# Patient Record
Sex: Male | Born: 1995 | Race: White | Hispanic: No | Marital: Single | Smoking: Never smoker
Health system: Southern US, Community
[De-identification: ages and names within clinical notes are randomized; demographics above are authoritative.]

## PROBLEM LIST (undated history)

## (undated) DIAGNOSIS — G919 Hydrocephalus, unspecified: Secondary | ICD-10-CM

## (undated) DIAGNOSIS — F909 Attention-deficit hyperactivity disorder, unspecified type: Secondary | ICD-10-CM

## (undated) DIAGNOSIS — I10 Essential (primary) hypertension: Secondary | ICD-10-CM

## (undated) DIAGNOSIS — R4689 Other symptoms and signs involving appearance and behavior: Secondary | ICD-10-CM

## (undated) DIAGNOSIS — F79 Unspecified intellectual disabilities: Secondary | ICD-10-CM

## (undated) DIAGNOSIS — F913 Oppositional defiant disorder: Secondary | ICD-10-CM

## (undated) HISTORY — PX: ELBOW SURGERY: SHX618

## (undated) HISTORY — PX: CSF SHUNT: SHX92

---
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
## 2014-02-19 ENCOUNTER — Encounter (HOSPITAL_COMMUNITY): Payer: Self-pay | Admitting: *Deleted

## 2014-02-19 ENCOUNTER — Emergency Department (HOSPITAL_COMMUNITY)
Admission: EM | Admit: 2014-02-19 | Discharge: 2014-02-21 | Disposition: A | Discharge status: Home / Self Care | Payer: Medicaid Other | Attending: Emergency Medicine | Admitting: Emergency Medicine

## 2014-02-19 DIAGNOSIS — Z79899 Other long term (current) drug therapy: Secondary | ICD-10-CM | POA: Insufficient documentation

## 2014-02-19 DIAGNOSIS — F909 Attention-deficit hyperactivity disorder, unspecified type: Secondary | ICD-10-CM | POA: Diagnosis not present

## 2014-02-19 DIAGNOSIS — Z8669 Personal history of other diseases of the nervous system and sense organs: Secondary | ICD-10-CM | POA: Diagnosis not present

## 2014-02-19 DIAGNOSIS — I10 Essential (primary) hypertension: Secondary | ICD-10-CM | POA: Insufficient documentation

## 2014-02-19 DIAGNOSIS — Y998 Other external cause status: Secondary | ICD-10-CM | POA: Diagnosis not present

## 2014-02-19 DIAGNOSIS — F911 Conduct disorder, childhood-onset type: Secondary | ICD-10-CM | POA: Diagnosis not present

## 2014-02-19 DIAGNOSIS — Y9389 Activity, other specified: Secondary | ICD-10-CM | POA: Insufficient documentation

## 2014-02-19 DIAGNOSIS — R4689 Other symptoms and signs involving appearance and behavior: Secondary | ICD-10-CM

## 2014-02-19 DIAGNOSIS — S40212A Abrasion of left shoulder, initial encounter: Secondary | ICD-10-CM | POA: Insufficient documentation

## 2014-02-19 DIAGNOSIS — Y9289 Other specified places as the place of occurrence of the external cause: Secondary | ICD-10-CM | POA: Insufficient documentation

## 2014-02-19 DIAGNOSIS — Z91040 Latex allergy status: Secondary | ICD-10-CM | POA: Diagnosis not present

## 2014-02-19 DIAGNOSIS — F419 Anxiety disorder, unspecified: Secondary | ICD-10-CM | POA: Diagnosis not present

## 2014-02-19 DIAGNOSIS — F79 Unspecified intellectual disabilities: Secondary | ICD-10-CM | POA: Diagnosis not present

## 2014-02-19 DIAGNOSIS — M549 Dorsalgia, unspecified: Secondary | ICD-10-CM | POA: Diagnosis present

## 2014-02-19 LAB — CBC
HCT: 44.4 % (ref 39.0–52.0)
HEMOGLOBIN: 14.7 g/dL (ref 13.0–17.0)
MCH: 26.9 pg (ref 26.0–34.0)
MCHC: 33.1 g/dL (ref 30.0–36.0)
MCV: 81.3 fL (ref 78.0–100.0)
Platelets: 237 10*3/uL (ref 150–400)
RBC: 5.46 MIL/uL (ref 4.22–5.81)
RDW: 13.1 % (ref 11.5–15.5)
WBC: 11.4 10*3/uL — ABNORMAL HIGH (ref 4.0–10.5)

## 2014-02-19 LAB — COMPREHENSIVE METABOLIC PANEL
ALK PHOS: 80 U/L (ref 39–117)
ALT: 15 U/L (ref 0–53)
AST: 17 U/L (ref 0–37)
Albumin: 4.5 g/dL (ref 3.5–5.2)
Anion gap: 13 (ref 5–15)
BUN: 13 mg/dL (ref 6–23)
CO2: 26 mEq/L (ref 19–32)
Calcium: 10.1 mg/dL (ref 8.4–10.5)
Chloride: 102 mEq/L (ref 96–112)
Creatinine, Ser: 0.9 mg/dL (ref 0.50–1.35)
GFR calc non Af Amer: >90 mL/min (ref >90)
GLUCOSE: 99 mg/dL (ref 70–99)
POTASSIUM: 4.5 meq/L (ref 3.7–5.3)
SODIUM: 141 meq/L (ref 137–147)
TOTAL PROTEIN: 8.1 g/dL (ref 6.0–8.3)
Total Bilirubin: 0.3 mg/dL (ref 0.3–1.2)

## 2014-02-19 LAB — ETHANOL: Alcohol, Ethyl (B): <11 mg/dL (ref 0–11)

## 2014-02-19 MED ORDER — ZIPRASIDONE MESYLATE 20 MG IM SOLR
INTRAMUSCULAR | Status: AC
  Filled 2014-02-19: qty 20

## 2014-02-19 MED ORDER — ZIPRASIDONE MESYLATE 20 MG IM SOLR
10.0000 mg | Freq: Once | INTRAMUSCULAR | Status: AC
  Administered 2014-02-19: 10 mg via INTRAMUSCULAR

## 2014-02-19 MED ORDER — STERILE WATER FOR INJECTION IJ SOLN
INTRAMUSCULAR | Status: AC
  Administered 2014-02-19: 1 mL
  Filled 2014-02-19: qty 10
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
## 2014-02-19 NOTE — BH Assessment (Signed)
Assessment completed. Consulted Nanine MeansJamison Lord, NP who recommended that pt be re-evaluated by psychiatry in the am. Everlene FarrierWilliam Dansie, PA has been informed of the recommendation. Contacted pt's legal guardian to informed her that he has been found and is safe at the hospital. She requested that this writer contact pt's case manager Graceann Congresshaewna Patterson to provide an update. Spoke with Graceann Congresshaewna Patterson to inform her that pt is safe at the hospital. Alvester Morinhaewna requested that this writer informed group home director Cindi Carbonhris Chavis of pt's location. Contact group home director and informed him that pt is safe at the hospital.
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
## 2014-02-19 NOTE — ED Notes (Signed)
Pt states he is unable to urinate at this time.

## 2014-02-20 DIAGNOSIS — F902 Attention-deficit hyperactivity disorder, combined type: Secondary | ICD-10-CM

## 2014-02-20 DIAGNOSIS — R45851 Suicidal ideations: Secondary | ICD-10-CM

## 2014-02-20 LAB — RAPID URINE DRUG SCREEN, HOSP PERFORMED
AMPHETAMINES: NOT DETECTED
BARBITURATES: NOT DETECTED
Benzodiazepines: NOT DETECTED
COCAINE: NOT DETECTED
Opiates: NOT DETECTED
TETRAHYDROCANNABINOL: NOT DETECTED

## 2014-02-20 LAB — URINALYSIS, ROUTINE W REFLEX MICROSCOPIC
Bilirubin Urine: NEGATIVE
Glucose, UA: NEGATIVE mg/dL
Hgb urine dipstick: NEGATIVE
Ketones, ur: NEGATIVE mg/dL
Leukocytes, UA: NEGATIVE
NITRITE: NEGATIVE
PROTEIN: NEGATIVE mg/dL
SPECIFIC GRAVITY, URINE: 1.018 (ref 1.005–1.030)
Urobilinogen, UA: 0.2 mg/dL (ref 0.0–1.0)
pH: 7 (ref 5.0–8.0)

## 2014-02-20 MED ORDER — ACETAMINOPHEN 325 MG PO TABS: 650.0000 mg | ORAL_TABLET | ORAL | Status: DC | PRN

## 2014-02-20 MED ORDER — OLANZAPINE 5 MG PO TABS
15.0000 mg | ORAL_TABLET | Freq: Every day | ORAL | Status: DC
  Administered 2014-02-20: 15 mg via ORAL
  Filled 2014-02-20 (×4): qty 1

## 2014-02-20 MED ORDER — LORAZEPAM 1 MG PO TABS: 1.0000 mg | ORAL_TABLET | Freq: Three times a day (TID) | ORAL | Status: DC | PRN

## 2014-02-20 NOTE — ED Notes (Signed)
Psychiatry team at bedside

## 2014-02-20 NOTE — ED Notes (Signed)
Pt moved to room 15 with all belongings, remains on cardiac monitor and pulse ox

## 2014-02-20 NOTE — ED Notes (Signed)
Initial Contact - pt sleeping soundly, sitter at bedside, further assessment deferred at this time 2/2 sleeping soundly.  RR even/un-lab.  Self repositioning for comfort.  NAD.

## 2014-02-20 NOTE — BH Assessment (Signed)
Tele Assessment Note   Dean Shepherd is an 18 y.o. male [facility] after being picked up by [facility]. Pt stated "I got into an argument with my roommate about football". "I like the cowboys". Pt reported that he became upset and just left home. Pt denies SI, HI and AVH at this time. Pt did not report any previous suicide attempts but shared that he has been hospitalized multiple times in the past for behavioral issues. Pt did not endorse any depressive symptoms nor did her report any stressors. Pt denies any illicit substance or alcohol abuse. Pt did not report any physical, sexual or emotional abuse at this time. Pt is drowsy but oriented x3. Pt is calm and cooperative at this time. Pt mood is pleasant; affect is congruent with mood. Pt thought process is coherent and relevant. Pt reported that he is compliant with his medication. Pt reported that he currently lives in a group home but was unable to provide the name or address. It is recommended that pt be evaluated by psychiatry in the morning.   Axis I: ADHD, combined type, Bipolar, Depressed, Oppositional Defiant Disorder and MR by hx.     Past Medical History:  Past Medical History  Diagnosis Date  . Hypertension   . Oppositional defiant behavior   . ADHD (attention deficit hyperactivity disorder)   . Hydrocephalus   . Mental retardation     Past Surgical History  Procedure Laterality Date  . Csf shunt    . Elbow surgery      Family History: History reviewed. No pertinent family history.  Social History:  reports that he has never smoked. He does not have any smokeless tobacco history on file. He reports that he does not drink alcohol or use illicit drugs.  Additional Social History:  Alcohol / Drug Use History of alcohol / drug use?: No history of alcohol / drug abuse  CIWA: CIWA-Ar BP: 132/65 mmHg Pulse Rate: 88 COWS:    PATIENT STRENGTHS: (choose at least two) Special hobby/interest Supportive  family/friends  Allergies:  Allergies  Allergen Reactions  . Latex Swelling    Home Medications:  (Not in a hospital admission)  OB/GYN Status:  No LMP for male patient.  General Assessment Data Location of Assessment: [facility] Is this a Tele or Face-to-Face Assessment?: Face-to-Face Is this an Initial Assessment or a Re-assessment for this encounter?: Initial Assessment Living Arrangements: Other (Comment) Can pt return to current living arrangement?: Yes Admission Status: Voluntary Is patient capable of signing voluntary admission?: Yes Transfer from: Unknown Referral Source: Other ([facility])     [facility] Living Arrangements: Other (Comment) [facility] of Psychiatrist: [facility]rapist[facility]  Education Status Is patient currently in school?: No  Risk to self with the past 6 months Suicidal Ideation: No Suicidal Intent: No Is patient at risk for suicide?: No Suicidal Plan?: No Access to Means: No What has been your use of drugs/alcohol within the last 12 months?: No drugs or alcohol abuse reported.  Previous Attempts/Gestures: No How many times?: 0 Other Self Harm Risks: No other self harm risk identified at this time. Triggers for Past Attempts: None known Intentional Self Injurious Behavior: None Family Suicide History: Unknown Recent stressful life event(s):  (No stressful events reported. ) Persecutory voices/beliefs?: No Depression: No Substance abuse history and/or treatment for substance abuse?: No Suicide prevention information given to non-admitted patients: Not applicable  Risk to Others within  the past 6 months Homicidal Ideation: No Thoughts of Harm to Others: No Current Homicidal Intent: No Current Homicidal Plan: No Access to Homicidal Means: No Identified Victim: NA History of harm to others?: Yes Assessment of Violence: In past 6-12 months Violent Behavior Description: Pt has been aggressive  towards medical staff and group home staff in the past.  Does patient have access to weapons?: No Criminal Charges Pending?: No Does patient have a court date: No  Psychosis Hallucinations: None noted Delusions: None noted  Mental Status Report Appear/Hygiene: In scrubs Eye Contact: Good Motor Activity: Freedom of movement Speech: Logical/coherent Level of Consciousness: Drowsy Mood: Pleasant Affect: Appropriate to circumstance Anxiety Level: None Thought Processes: Coherent, Relevant Judgement: Partial Orientation: Appropriate for developmental age Obsessive Compulsive Thoughts/Behaviors: None  Cognitive Functioning Concentration: Normal Memory: Recent Intact, Remote Intact IQ: Average Insight: Fair Impulse Control: Fair Appetite: Good Weight Loss: 0 Weight Gain: 0 Sleep: No Change Total Hours of Sleep: 8 Vegetative Symptoms: None  ADLScreening [facility]) Patient's cognitive ability adequate to safely complete daily activities?: Yes Patient able to express need for assistance with ADLs?: Yes Independently performs ADLs?: Yes (appropriate for developmental age)  Prior Inpatient Therapy Prior Inpatient Therapy: Yes Prior Therapy Dates: 2014 Prior Therapy Facilty/Provider(s): [facility] Reason for Treatment: Behavioral   Prior Outpatient Therapy Prior Outpatient Therapy: Yes Prior Therapy Dates: 2015 Prior Therapy Facilty/Provider(s): [facility] Reason for Treatment: MRDD  ADL Screening (condition at time of admission) Patient's cognitive ability adequate to safely complete daily activities?: Yes Patient able to express need for assistance with ADLs?: Yes Independently performs ADLs?: Yes (appropriate for developmental age)       Abuse/Neglect Assessment (Assessment to be complete while patient is alone) Physical Abuse: Denies Verbal Abuse: Denies Sexual Abuse: Denies Exploitation of patient/patient's resources:  Denies Self-Neglect: Denies     Merchant navy officerAdvance Directives (For Healthcare) Does patient have an advance directive?: No Would patient like information on creating an advanced directive?: No - patient declined information    Additional Information 1:1 In Past 12 Months?: No CIRT Risk: Yes Elopement Risk: Yes Does patient have medical clearance?: Yes     Disposition:  Disposition Initial Assessment Completed for this Encounter: Yes Disposition of Patient: Other dispositions Other disposition(s): Other (Comment) (Psychiatric evaluation in the morning. )  Deloria Brassfield S 02/20/2014 1:14 AM

## 2014-02-20 NOTE — ED Notes (Signed)
Pt ambulatory with steady gait to BR.

## 2014-02-20 NOTE — ED Notes (Signed)
Bed: [id number] Expected date:  Expected time:  Means of arrival:  Comments: Nater

## 2014-02-20 NOTE — ED Notes (Signed)
Order given for Geodon, as pt continues to fight restraints. Pt unable to follow commands, remains combative.
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
## 2014-02-20 NOTE — ED Notes (Signed)
Pt given sandwich and soda per request, tolerating well.  Watching TV, remains calm/coop.  Sitter remains at bedside.  NAD.
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
## 2014-02-20 NOTE — ED Notes (Signed)
One pt belongings bag in LOCKER 31.

## 2014-02-20 NOTE — ED Notes (Signed)
Lequita HaltMorgan RN aware unable to give Zyprexa as prescribed at 0030 d/t pt sleeping soundly.

## 2014-02-20 NOTE — ED Notes (Addendum)
Pt arousable to verbal stim, sitter remains at bedside.  Pt denies needs/complaints at this time.  Calm/coop.  Resting soundly.  Skin PWD.  MAEI, self repositioning for comfort.  Zyprexa held at this time as ordered as nighttime medication.  NAD. Will CTM.

## 2014-02-21 ENCOUNTER — Encounter (HOSPITAL_COMMUNITY): Payer: Self-pay | Admitting: Registered Nurse

## 2014-02-21 DIAGNOSIS — F913 Oppositional defiant disorder: Secondary | ICD-10-CM

## 2014-02-21 DIAGNOSIS — F909 Attention-deficit hyperactivity disorder, unspecified type: Secondary | ICD-10-CM

## 2014-02-21 DIAGNOSIS — F3130 Bipolar disorder, current episode depressed, mild or moderate severity, unspecified: Secondary | ICD-10-CM

## 2014-02-21 NOTE — Discharge Instructions (Signed)
Aggression °Physically aggressive behavior is common among small children. When frustrated or angry, toddlers may act out. Often, they will push, bite, or hit. Most children show less physical aggression as they grow up. Their language and interpersonal skills improve, too. But continued aggressive behavior is a sign of a problem. This behavior can lead to aggression and delinquency in adolescence and adulthood. °Aggressive behavior can be psychological or physical. Forms of psychological aggression include threatening or bullying others. Forms of physical aggression include:  °· Pushing. °· Hitting. °· Slapping. °· Kicking. °· Stabbing. °· Shooting. °· Raping.  °PREVENTION  °Encouraging the following behaviors can help manage aggression: °· Respecting others and valuing differences. °· Participating in school and community functions, including sports, music, after-school programs, community groups, and volunteer work. °· Talking with an adult when they are sad, depressed, fearful, anxious, or angry. Discussions with a parent or other family member, counselor, teacher, or coach can help. °· Avoiding alcohol and drug use. °· Dealing with disagreements without aggression, such as conflict resolution. To learn this, children need parents and caregivers to model respectful communication and problem solving. °· Limiting exposure to aggression and violence, such as video games that are not age appropriate, violence in the media, or domestic violence. °Document Released: 12/22/2006 Document Revised: 05/19/2011 Document Reviewed: 05/02/2010 °ExitCare® Patient Information ©2015 [employer]. This information is not intended to replace advice given to you by your health care provider. Make sure you discuss any questions you have with your health care provider. ° °

## 2014-02-21 NOTE — Progress Notes (Signed)
CSW reached out to [place] who is Catering managerthe director of the patients group home to confirm that the the facility will still be picking up the patient. The director did not answer the phone. CSW left a voice message informing the director to call back.  Trish MageBrittney Delainie Chavana, LCSWA [phone] ED CSW 02/21/2014 4:21 PM

## 2014-02-21 NOTE — Progress Notes (Signed)
Per psychiatrist and NP, patient psychiatrically stable for dc back to group home. CSW spoke to [facility], who shared that they can provide transprotation by 3pm. Per Thayer Oh[facility], patietn to not be informed about discharge until group home is there. Pt is fond of [facility], [facility], and [facility] and [facility] is trying to make arrangments for one of the three to pick up patient. CSW informed RN.  Byrd HesselbachKristen Aritza Brunet, LCSW [phone]  ED CSW 02/21/2014 1240pm

## 2014-02-21 NOTE — [facility] Suicide Risk Assessment (Cosign Needed)
Suicide Risk Assessment  Discharge Assessment     Demographic Factors:  Male and Caucasian  Total Time spent with patient: 30 minutes  Psychiatric Specialty Exam:     Blood pressure 109/57, pulse 88, temperature 98.5 F (36.9 C), temperature source Oral, resp. rate 16, SpO2 98 %.There is no height or weight on file to calculate BMI.  General Appearance: Casual  Eye Contact:: Good  Speech: Normal Rate  Volume: Normal  Mood: "feel better"  Affect: Congruent  Thought Process: Coherent  Orientation: Full (Time, Place, and Person)  Thought Content: WDL  Suicidal Thoughts: No  Homicidal Thoughts: No  Memory: Immediate; Good Recent; Good Remote; Fair  Judgement: Fair  Insight: Fair  Psychomotor Activity: Normal  Concentration: Fair  Recall: NA  Fund of Knowledge:Fair  Language: Good  Akathisia: NA  Handed: Right  AIMS (if indicated):    Assets: Desire for Improvement  Sleep:     Musculoskeletal: Strength & Muscle Tone: within normal limits Gait & Station: Did not see patient ambulate Patient leans: N/A  Mental Status Per Nursing Assessment::   On Admission:     Current Mental Status by Physician: Denies suicidal/homicidal ideation, psychosis, and paranoia  Loss Factors: NA  Historical Factors: NA  Risk Reduction Factors:   Positive social support  Continued Clinical Symptoms:  Previous Psychiatric Diagnoses and Treatments  Cognitive Features That Contribute To Risk:  Closed-mindedness Loss of executive function    Suicide Risk:  Minimal: No identifiable suicidal ideation.  Patients presenting with no risk factors but with morbid ruminations; may be classified as minimal risk based on the severity of the depressive symptoms  Discharge Diagnoses: AXIS I: ADHD, combined type, Bipolar, Depressed, Oppositional Defiant Disorder and Mental Retardation by history AXIS II: Deferred AXIS III:  Past  Medical History  Diagnosis Date  . Hypertension   . Oppositional defiant behavior   . ADHD (attention deficit hyperactivity disorder)   . Hydrocephalus   . Mental retardation    AXIS IV: other psychosocial or environmental problems and problems related to social environment AXIS V: 61-70 mild symptoms  Plan Of Care/Follow-up recommendations:  Activity:  As tolerated Diet:  As tolerated Other:  Follow up with primary psych provider  Is patient on multiple antipsychotic therapies at discharge:  No   Has Patient had three or more failed trials of antipsychotic monotherapy by history:  No  Recommended Plan for Multiple Antipsychotic Therapies: NA    Rankin, Shuvon 02/21/2014, 12:51 PM

## 2014-02-21 NOTE — ED Notes (Signed)
Sitter has sign off

## 2014-02-21 NOTE — Consult Note (Signed)
[facility] Face-to-Face Psychiatry Consult   Reason for Consult:  Aggressive  Behavior  Referring Physician:  EDP Marshaun Lortie is an 18 y.o. male. Total Time spent with patient: 45 minutes  Assessment: DSM5  Axis I: ADHD, combined type, Bipolar, Depressed, Oppositional Defiant Disorder and MR by history Axis II: Deferred Axis III:  Past Medical History  Diagnosis Date  . Hypertension   . Oppositional defiant behavior   . ADHD (attention deficit hyperactivity disorder)   . Hydrocephalus   . Mental retardation    Axis IV: other psychosocial or environmental problems Axis V: 61-70 mild symptoms   ADHD, combined type, Bipolar, Depressed, Oppositional Defiant Disorder and MR by hx.    Past Medical History  Diagnosis Date  . Hypertension   . Oppositional defiant behavior   . ADHD (attention deficit hyperactivity disorder)   . Hydrocephalus   . Mental retardation     Plan:  No evidence of imminent risk to self or others at present.   Patient does not meet criteria for psychiatric inpatient admission. Follow up with outpatient psych provider  Subjective:   Emmert Roethler is a 18 y.o. male patient admitted with Aggressive behavior.  HPI: Today patient states that he is feeling better and that he is not angry.  Patient has been calm and cooperative.  Patient denies suicidal/homicidal ideation, psychosis, and paranoia.     HPI Elements:   Location:  Aggression, Agitataion, Oppositional Defiant Disorder. Quality:  Moderate to severe. Severity:  Moderate to severe. Timing:  Acute. Context:  Needing assistance with behavior issues..  Past Psychiatric History: Past Medical History  Diagnosis Date  . Hypertension   . Oppositional defiant behavior   . ADHD (attention deficit hyperactivity disorder)   . Hydrocephalus   . Mental retardation     reports that he has never smoked. He does not have any smokeless tobacco history on file. He reports that he does not drink alcohol  or use illicit drugs. History reviewed. No pertinent family history. Family History Substance Abuse: No Family Supports: Yes, List: (Group home staff) Living Arrangements: Other (Comment) Can pt return to current living arrangement?: Yes Abuse/Neglect [facility]) Physical Abuse: Denies Verbal Abuse: Denies Sexual Abuse: Denies Allergies:   Allergies  Allergen Reactions  . Latex Swelling    ACT Assessment Complete:  Yes:    Educational Status    Risk to Self: Risk to self with the past 6 months Suicidal Ideation: No Suicidal Intent: No Is patient at risk for suicide?: No Suicidal Plan?: No Access to Means: No What has been your use of drugs/alcohol within the last 12 months?: No drugs or alcohol abuse reported.  Previous Attempts/Gestures: No How many times?: 0 Other Self Harm Risks: No other self harm risk identified at this time. Triggers for Past Attempts: None known Intentional Self Injurious Behavior: None Family Suicide History: Unknown Recent stressful life event(s):  (No stressful events reported. ) Persecutory voices/beliefs?: No Depression: No Substance abuse history and/or treatment for substance abuse?: No Suicide prevention information given to non-admitted patients: Not applicable  Risk to Others: Risk to Others within the past 6 months Homicidal Ideation: No Thoughts of Harm to Others: No Current Homicidal Intent: No Current Homicidal Plan: No Access to Homicidal Means: No Identified Victim: NA History of harm to others?: Yes Assessment of Violence: In past 6-12 months Violent Behavior Description: Pt has been aggressive towards medical staff and group home staff in the past.  Does patient have access to weapons?: No  Criminal Charges Pending?: No Does patient have a court date: No  Abuse: Abuse/Neglect Assessment (Assessment to be complete while patient is alone) Physical Abuse: Denies Verbal Abuse: Denies Sexual Abuse: Denies Exploitation of  patient/patient's resources: Denies Self-Neglect: Denies  Prior Inpatient Therapy: Prior Inpatient Therapy Prior Inpatient Therapy: Yes Prior Therapy Dates: 2014 Prior Therapy Facilty/Provider(s): [facility] Reason for Treatment: Behavioral   Prior Outpatient Therapy: Prior Outpatient Therapy Prior Outpatient Therapy: Yes Prior Therapy Dates: 2015 Prior Therapy Facilty/Provider(s): [facility] Reason for Treatment: MRDD  Additional Information: Additional Information 1:1 In Past 12 Months?: No CIRT Risk: Yes Elopement Risk: Yes Does patient have medical clearance?: Yes                  Objective: Blood pressure 109/57, pulse 88, temperature 98.5 F (36.9 C), temperature source Oral, resp. rate 16, SpO2 98 %.There is no height or weight on file to calculate BMI. Results for orders placed or performed during the hospital encounter of 02/19/14 (from the past 72 hour(s))  CBC     Status: Abnormal   Collection Time: 02/19/14  7:53 PM  Result Value Ref Range   WBC 11.4 (H) 4.0 - 10.5 K/uL   RBC 5.46 4.22 - 5.81 MIL/uL   Hemoglobin 14.7 13.0 - 17.0 g/dL   HCT 44.4 39.0 - 52.0 %   MCV 81.3 78.0 - 100.0 fL   MCH 26.9 26.0 - 34.0 pg   MCHC 33.1 30.0 - 36.0 g/dL   RDW 13.1 11.5 - 15.5 %   Platelets 237 150 - 400 K/uL  Comprehensive metabolic panel     Status: None   Collection Time: 02/19/14  7:53 PM  Result Value Ref Range   Sodium 141 137 - 147 mEq/L   Potassium 4.5 3.7 - 5.3 mEq/L   Chloride 102 96 - 112 mEq/L   CO2 26 19 - 32 mEq/L   Glucose, Bld 99 70 - 99 mg/dL   BUN 13 6 - 23 mg/dL   Creatinine, Ser 0.90 0.50 - 1.35 mg/dL   Calcium 10.1 8.4 - 10.5 mg/dL   Total Protein 8.1 6.0 - 8.3 g/dL   Albumin 4.5 3.5 - 5.2 g/dL   AST 17 0 - 37 U/L   ALT 15 0 - 53 U/L   Alkaline Phosphatase 80 39 - 117 U/L   Total Bilirubin 0.3 0.3 - 1.2 mg/dL   GFR calc non Af Amer >90 >90 mL/min   GFR calc Af Amer >90 >90 mL/min    Comment: (NOTE) The eGFR has  been calculated using the CKD EPI equation. This calculation has not been validated in all clinical situations. eGFR's persistently <90 mL/min signify possible Chronic Kidney Disease.    Anion gap 13 5 - 15  Ethanol     Status: None   Collection Time: 02/19/14  7:53 PM  Result Value Ref Range   Alcohol, Ethyl (B) <11 0 - 11 mg/dL    Comment:        LOWEST DETECTABLE LIMIT FOR SERUM ALCOHOL IS 11 mg/dL FOR MEDICAL PURPOSES ONLY   Urine rapid drug screen (hosp performed)     Status: None   Collection Time: 02/19/14 11:43 PM  Result Value Ref Range   Opiates NONE DETECTED NONE DETECTED   Cocaine NONE DETECTED NONE DETECTED   Benzodiazepines NONE DETECTED NONE DETECTED   Amphetamines NONE DETECTED NONE DETECTED   Tetrahydrocannabinol NONE DETECTED NONE DETECTED   Barbiturates NONE DETECTED NONE DETECTED  Comment:        DRUG SCREEN FOR MEDICAL PURPOSES ONLY.  IF CONFIRMATION IS NEEDED FOR ANY PURPOSE, NOTIFY LAB WITHIN 5 DAYS.        LOWEST DETECTABLE LIMITS FOR URINE DRUG SCREEN Drug Class       Cutoff (ng/mL) Amphetamine      1000 Barbiturate      200 Benzodiazepine   268 Tricyclics       341 Opiates          300 Cocaine          300 THC              50   Urinalysis, Routine w reflex microscopic     Status: None   Collection Time: 02/19/14 11:43 PM  Result Value Ref Range   Color, Urine YELLOW YELLOW   APPearance CLEAR CLEAR   Specific Gravity, Urine 1.018 1.005 - 1.030   pH 7.0 5.0 - 8.0   Glucose, UA NEGATIVE NEGATIVE mg/dL   Hgb urine dipstick NEGATIVE NEGATIVE   Bilirubin Urine NEGATIVE NEGATIVE   Ketones, ur NEGATIVE NEGATIVE mg/dL   Protein, ur NEGATIVE NEGATIVE mg/dL   Urobilinogen, UA 0.2 0.0 - 1.0 mg/dL   Nitrite NEGATIVE NEGATIVE   Leukocytes, UA NEGATIVE NEGATIVE    Comment: MICROSCOPIC NOT DONE ON URINES WITH NEGATIVE PROTEIN, BLOOD, LEUKOCYTES, NITRITE, OR GLUCOSE <1000 mg/dL.   Labs are reviewed and are pertinent for WNL.  Current  Facility-Administered Medications  Medication Dose Route Frequency Provider Last Rate Last Dose  . acetaminophen (TYLENOL) tablet 650 mg  650 mg Oral Q4H PRN Verda Cumins Dansie, PA-C      . LORazepam (ATIVAN) tablet 1 mg  1 mg Oral Q8H PRN Verda Cumins Dansie, PA-C      . OLANZapine [facility]) tablet 15 mg  15 mg Oral QHS Verda Cumins Dansie, PA-C   15 mg at 02/20/14 2202   Current Outpatient Prescriptions  Medication Sig Dispense Refill  . amantadine (SYMMETREL) 100 MG capsule Take 100 mg by mouth 2 (two) times daily.    . divalproex (DEPAKOTE) 250 MG DR tablet Take 250 mg by mouth 2 (two) times daily.    [name] lamoTRIgine (LAMICTAL) 100 MG tablet Take 100 mg by mouth daily.    [name] LORazepam (ATIVAN) 1 MG tablet Take 1 mg by mouth 2 (two) times daily as needed for anxiety.    . Multiple Vitamin (MULTIVITAMIN WITH MINERALS) TABS tablet Take 1 tablet by mouth daily.    [name] OLANZapine (ZYPREXA) 10 MG tablet Take 10 mg by mouth at bedtime.    . propranolol (INDERAL) 10 MG tablet Take 10 mg by mouth 2 (two) times daily.    [name] lamoTRIgine (LAMICTAL) 25 MG tablet Take 1 tablet (25 mg total) by mouth daily. (Patient not taking: Reported on 02/19/2014) 1 tablet 0  . OLANZapine (ZYPREXA) 20 MG tablet Take 1 tablet (20 mg total) by mouth at bedtime. (Patient not taking: Reported on 02/19/2014) 1 tablet 0    Psychiatric Specialty Exam:     Blood pressure 109/57, pulse 88, temperature 98.5 F (36.9 C), temperature source Oral, resp. rate 16, SpO2 98 %.There is no height or weight on file to calculate BMI.  General Appearance: Casual  Eye Contact::  Good  Speech:  Normal Rate  Volume:  Normal  Mood:  "feel better"  Affect:  Congruent  Thought Process:  Coherent  Orientation:  Full (Time, Place, and Person)  Thought Content:  WDL  Suicidal Thoughts:  No  Homicidal Thoughts:  No  Memory:  Immediate;   Good Recent;   Good Remote;   Fair  Judgement:  Fair  Insight:  Fair  Psychomotor Activity:   Normal  Concentration:  Fair  Recall:  NA  Fund of Knowledge:Fair  Language: Good  Akathisia:  NA  Handed:  Right  AIMS (if indicated):     Assets:  Desire for Improvement  Sleep:      Musculoskeletal: Strength & Muscle Tone: within normal limits Gait & Station: Did not see patient ambulate Patient leans: N/A  Treatment Plan Summary: Discharge back to group home.  Patient to follow up with primary outpatient psych provider  Earleen Newport, FNP-BC 02/21/2014 12:36 PM   Patient seen, evaluated and I agree with notes by Nurse Practitioner. Corena Pilgrim, MD

## 2014-04-17 ENCOUNTER — Emergency Department (HOSPITAL_COMMUNITY)
Admission: EM | Admit: 2014-04-17 | Discharge: 2014-04-19 | Disposition: A | Discharge status: Home / Self Care | Payer: Medicaid Other | Attending: Emergency Medicine | Admitting: Emergency Medicine

## 2014-04-17 ENCOUNTER — Encounter (HOSPITAL_COMMUNITY): Payer: Self-pay

## 2014-04-17 DIAGNOSIS — R4689 Other symptoms and signs involving appearance and behavior: Secondary | ICD-10-CM

## 2014-04-17 DIAGNOSIS — I10 Essential (primary) hypertension: Secondary | ICD-10-CM | POA: Insufficient documentation

## 2014-04-17 DIAGNOSIS — Z8669 Personal history of other diseases of the nervous system and sense organs: Secondary | ICD-10-CM | POA: Diagnosis not present

## 2014-04-17 DIAGNOSIS — Z91040 Latex allergy status: Secondary | ICD-10-CM | POA: Insufficient documentation

## 2014-04-17 DIAGNOSIS — R45851 Suicidal ideations: Secondary | ICD-10-CM | POA: Diagnosis present

## 2014-04-17 DIAGNOSIS — F911 Conduct disorder, childhood-onset type: Secondary | ICD-10-CM | POA: Diagnosis not present

## 2014-04-17 DIAGNOSIS — F909 Attention-deficit hyperactivity disorder, unspecified type: Secondary | ICD-10-CM | POA: Diagnosis not present

## 2014-04-17 DIAGNOSIS — Z79899 Other long term (current) drug therapy: Secondary | ICD-10-CM | POA: Diagnosis not present

## 2014-04-17 LAB — CBC WITH DIFFERENTIAL/PLATELET
BASOS ABS: 0 10*3/uL (ref 0.0–0.1)
Basophils Relative: 0 % (ref 0–1)
EOS PCT: 5 % (ref 0–5)
Eosinophils Absolute: 0.5 10*3/uL (ref 0.0–0.7)
HEMATOCRIT: 43 % (ref 39.0–52.0)
HEMOGLOBIN: 13.9 g/dL (ref 13.0–17.0)
LYMPHS ABS: 2.7 10*3/uL (ref 0.7–4.0)
Lymphocytes Relative: 26 % (ref 12–46)
MCH: 26.2 pg (ref 26.0–34.0)
MCHC: 32.3 g/dL (ref 30.0–36.0)
MCV: 81.1 fL (ref 78.0–100.0)
MONOS PCT: 14 % — AB (ref 3–12)
Monocytes Absolute: 1.5 10*3/uL — ABNORMAL HIGH (ref 0.1–1.0)
Neutro Abs: 5.9 10*3/uL (ref 1.7–7.7)
Neutrophils Relative %: 55 % (ref 43–77)
Platelets: 236 10*3/uL (ref 150–400)
RBC: 5.3 MIL/uL (ref 4.22–5.81)
RDW: 12.8 % (ref 11.5–15.5)
WBC: 10.6 10*3/uL — AB (ref 4.0–10.5)

## 2014-04-17 LAB — RAPID URINE DRUG SCREEN, HOSP PERFORMED
Amphetamines: NOT DETECTED
BENZODIAZEPINES: NOT DETECTED
Barbiturates: NOT DETECTED
Cocaine: NOT DETECTED
Opiates: NOT DETECTED
Tetrahydrocannabinol: NOT DETECTED

## 2014-04-17 MED ORDER — LORAZEPAM 1 MG PO TABS
1.0000 mg | ORAL_TABLET | Freq: Two times a day (BID) | ORAL | Status: DC | PRN
  Administered 2014-04-19: 1 mg via ORAL

## 2014-04-17 MED ORDER — DIVALPROEX SODIUM 250 MG PO DR TAB
250.0000 mg | DELAYED_RELEASE_TABLET | Freq: Two times a day (BID) | ORAL | Status: DC
  Administered 2014-04-18: 250 mg via ORAL
  Filled 2014-04-17 (×2): qty 1

## 2014-04-17 MED ORDER — OLANZAPINE 5 MG PO TABS: 5.0000 mg | ORAL_TABLET | Freq: Two times a day (BID) | ORAL | Status: DC | PRN

## 2014-04-17 MED ORDER — IBUPROFEN 200 MG PO TABS: 600.0000 mg | ORAL_TABLET | Freq: Three times a day (TID) | ORAL | Status: DC | PRN

## 2014-04-17 MED ORDER — MINOCYCLINE HCL 100 MG PO CAPS
100.0000 mg | ORAL_CAPSULE | Freq: Every day | ORAL | Status: DC
  Filled 2014-04-17 (×2): qty 1

## 2014-04-17 MED ORDER — ACETAMINOPHEN 325 MG PO TABS: 650.0000 mg | ORAL_TABLET | ORAL | Status: DC | PRN

## 2014-04-17 MED ORDER — LORAZEPAM 1 MG PO TABS
1.0000 mg | ORAL_TABLET | Freq: Three times a day (TID) | ORAL | Status: DC | PRN
  Filled 2014-04-17: qty 1

## 2014-04-17 MED ORDER — PROPRANOLOL HCL 10 MG PO TABS
10.0000 mg | ORAL_TABLET | Freq: Two times a day (BID) | ORAL | Status: DC
  Administered 2014-04-18: 10 mg via ORAL
  Filled 2014-04-17 (×4): qty 1

## 2014-04-17 MED ORDER — LAMOTRIGINE 100 MG PO TABS
100.0000 mg | ORAL_TABLET | Freq: Every day | ORAL | Status: DC
  Filled 2014-04-17 (×2): qty 1

## 2014-04-17 MED ORDER — AMANTADINE HCL 100 MG PO CAPS
100.0000 mg | ORAL_CAPSULE | Freq: Two times a day (BID) | ORAL | Status: DC
  Administered 2014-04-18: 100 mg via ORAL
  Filled 2014-04-17 (×6): qty 1

## 2014-04-17 NOTE — BH Assessment (Signed)
Reviewed EDP note prior to initiating assessment. [facility] Reports SI with attempt to hang himself with a belt, and was seen with belt around his neck.   Assessment to commence shortly.    Clista BernhardtNancy Kellene Mccleary, [facility] Triage Specialist 04/17/2014 11:45 PM

## 2014-04-17 NOTE — ED Provider Notes (Addendum)
CSN: [id number]     Arrival date & time 04/17/14  2306 History  This chart was scribed for non-physician practitioner, Sabino DickGail Shulz, NP-C working with Raeford RazorStephen Kohut, MD by Luisa DagoPriscilla Tutu, ED scribe. This patient was seen in room WTR4/WLPT4 and the patient's care was started at 11:32 PM.    Chief Complaint  Patient presents with  . Suicidal   The history is provided by the patient and medical records. No language interpreter was used.   HPI Comments: Dean Shepherd is a 19 y.o. male brought in by ambulance, who presents to the Emergency Department complaining of SI. Pt states that he is depressed and he has been having suicidal ideations these past few days. Admits to having a plan (hanging himself with a belt). He was seen with a belt around his neck earlier today. Pt has a hx of one episode of SI for which he had to be hospitalized for when he was 19 years old. He endorses taking his prescribed medication daily. Denies any HI.    Past Medical History  Diagnosis Date  . Hypertension   . Oppositional defiant behavior   . ADHD (attention deficit hyperactivity disorder)   . Hydrocephalus   . Mental retardation    Past Surgical History  Procedure Laterality Date  . Csf shunt    . Elbow surgery     History reviewed. No pertinent family history. History  Substance Use Topics  . Smoking status: Never Smoker   . Smokeless tobacco: Not on file  . Alcohol Use: No    Review of Systems  Constitutional: Negative for fever.  HENT: Negative for congestion, sore throat and trouble swallowing.   Eyes: Negative.   Respiratory: Negative for shortness of breath.   Cardiovascular: Negative for chest pain.  Gastrointestinal: Negative for nausea.  Genitourinary: Negative.   Musculoskeletal: Negative for joint swelling, arthralgias and neck pain.  Skin: Positive for wound.  Psychiatric/Behavioral: Positive for suicidal ideas.      Allergies  Latex  Home Medications   Prior to Admission  medications   Medication Sig Start Date End Date Taking? Authorizing Provider  acetaminophen (TYLENOL) 325 MG tablet Take 650 mg by mouth every 6 (six) hours as needed (for pain/headache).   Yes Historical Provider, MD  amantadine (SYMMETREL) 100 MG capsule Take 100 mg by mouth 2 (two) times daily.   Yes Historical Provider, MD  divalproex (DEPAKOTE) 250 MG DR tablet Take 250 mg by mouth 2 (two) times daily.   Yes Historical Provider, MD  lamoTRIgine (LAMICTAL) 100 MG tablet Take 100 mg by mouth daily.   Yes Historical Provider, MD  LORazepam (ATIVAN) 1 MG tablet Take 1 mg by mouth 2 (two) times daily as needed for anxiety.   Yes Historical Provider, MD  minocycline (MINOCIN,DYNACIN) 100 MG capsule Take 100 mg by mouth daily.   Yes Historical Provider, MD  Multiple Vitamin (MULTIVITAMIN WITH MINERALS) TABS tablet Take 1 tablet by mouth daily.   Yes Historical Provider, MD  OLANZapine (ZYPREXA) 10 MG tablet Take 25 mg by mouth at bedtime.    Yes Historical Provider, MD  OLANZapine (ZYPREXA) 5 MG tablet Take 5 mg by mouth 2 (two) times daily as needed (severe agitation).   Yes Historical Provider, MD  propranolol (INDERAL) 10 MG tablet Take 10 mg by mouth 2 (two) times daily.   Yes Historical Provider, MD  pyrithione zinc (HEAD AND SHOULDERS) 1 % shampoo Apply 1 application topically daily.   Yes Historical Provider, MD  Soap &  Cleansers (NEUTROGENA ACNE CLEANSING SOAP EX) Apply 1 application topically 2 (two) times daily.   Yes Historical Provider, MD   BP 125/78 mmHg  Pulse 95  Temp(Src) 98.1 F (36.7 C) (Oral)  Resp 18  SpO2 98%  Physical Exam  Constitutional: He is oriented to person, place, and time. He appears well-developed and well-nourished.  HENT:  Head: Normocephalic and atraumatic.  Mouth/Throat: Oropharynx is clear and moist.  Superficial scratches to the left side of the neck  Neck: Normal range of motion.  Cardiovascular: Normal rate.   Pulmonary/Chest: Effort normal.   Abdominal: He exhibits no distension.  Lymphadenopathy:    He has no cervical adenopathy.  Neurological: He is alert and oriented to person, place, and time.  Skin: Skin is warm and dry.  Psychiatric: He has a normal mood and affect.  Nursing note and vitals reviewed.   ED Course  Procedures (including critical care time)  DIAGNOSTIC STUDIES: Oxygen Saturation is 98% on RA, normal by my interpretation.    COORDINATION OF CARE: 11:38 PM- Pt advised of plan for treatment and pt agrees.  Labs Review Labs Reviewed  CBC WITH DIFFERENTIAL/PLATELET - Abnormal; Notable for the following:    WBC 10.6 (*)    Monocytes Relative 14 (*)    Monocytes Absolute 1.5 (*)    All other components within normal limits  VALPROIC ACID LEVEL - Abnormal; Notable for the following:    Valproic Acid Lvl 33.2 (*)    All other components within normal limits  COMPREHENSIVE METABOLIC PANEL  URINE RAPID DRUG SCREEN (HOSP PERFORMED)  ETHANOL  LAMOTRIGINE LEVEL    Imaging Review No results found.   EKG Interpretation None     Patient states that the scratches on the side of her neck or from playing basketball earlier in the day when he was accidentally scratched by a teammate Patient has been evaluated by TTS he needs criteria for admission.  They're actively seeking placement for him any specialized unit for children with developmental delays MDM   Final diagnoses:  None   I personally performed the services described in this documentation, which was scribed in my presence. The recorded information has been reviewed and is accurate.   Arman Filter, NP 04/18/14 0230  Raeford Razor, MD 04/20/14 1010  Arman Filter, NP 04/20/14 2004  Raeford Razor, MD 04/27/14 0730  Arman Filter, NP 05/09/14 2002  Raeford Razor, MD 05/10/14 [phone]

## 2014-04-17 NOTE — ED Notes (Signed)
Pt lived in a group home and tonight ran away from there and was found at the store, pt attempted to wrap a belt around his neck earlier today.

## 2014-04-17 NOTE — ED Notes (Signed)
Bed: WLPT4 Expected date:  Expected time:  Means of arrival:  Comments: EMS suicidal from group home

## 2014-04-18 LAB — COMPREHENSIVE METABOLIC PANEL
ALT: 18 U/L (ref 0–53)
ANION GAP: 11 (ref 5–15)
AST: 32 U/L (ref 0–37)
Albumin: 4.5 g/dL (ref 3.5–5.2)
Alkaline Phosphatase: 87 U/L (ref 39–117)
BILIRUBIN TOTAL: 0.5 mg/dL (ref 0.3–1.2)
BUN: 11 mg/dL (ref 6–23)
CHLORIDE: 106 mmol/L (ref 96–112)
CO2: 22 mmol/L (ref 19–32)
CREATININE: 0.95 mg/dL (ref 0.50–1.35)
Calcium: 9.4 mg/dL (ref 8.4–10.5)
Glucose, Bld: 97 mg/dL (ref 70–99)
Potassium: 3.5 mmol/L (ref 3.5–5.1)
Sodium: 139 mmol/L (ref 135–145)
Total Protein: 7.7 g/dL (ref 6.0–8.3)

## 2014-04-18 LAB — VALPROIC ACID LEVEL: Valproic Acid Lvl: 33.2 ug/mL — ABNORMAL LOW (ref 50.0–100.0)

## 2014-04-18 LAB — ETHANOL: Alcohol, Ethyl (B): <5 mg/dL (ref 0–9)

## 2014-04-18 NOTE — BH Assessment (Signed)
Pt reports his guardian is his mother Ander PurpuraDawn Fortune-Brown [phone]   Clista BernhardtNancy Revin Corker, [facility] Triage Specialist 04/18/2014 12:36 AM

## 2014-04-18 NOTE — Progress Notes (Signed)
CSW called patient mother/legal guardian with confirmed phone number. CSW updated patient mother/guardian regarding patient admission to the ED regarding SI and running away from the group home. Pt mother/guardian would like to remain updated. CSW will continue to update. CSW familiar with patient and patient mother/guardian who lives in [place].
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
## 2014-04-18 NOTE — Progress Notes (Addendum)
[facility] and serves as patient  Advocate as patient family is out of state. Per advocate, there is one participant at day program who is giving difficulty to other participants that are at the same group home as Joselyn Glassmanyler. Per advocate, patient is requesting one on one at day program, however pt is not meeting criteria for the one on one.   Byrd HesselbachKristen Aymen Widrig, LCSW [phone]  ED CSW 04/18/2014 1:53 PM

## 2014-04-18 NOTE — Progress Notes (Signed)
CSW reached out to mom who is also the pt's guardian to inform her that patient has been [facility].  Per note, mom lives in [place].  Dean Shepherd [phone]  Dean Shepherd, LCSWA [phone] ED CSW 04/18/2014 4:49 PM

## 2014-04-18 NOTE — ED Notes (Signed)
Patient's guardian Audria NineDawn Brown called requesting information about the patient status. Author informed patient that this information could not be provided over the phone. CSW knows guardian and is willing to call and speak with the her.

## 2014-04-18 NOTE — ED Notes (Signed)
Sheriff called back. Pt will be transported tomorrow am. Will call in morning for exact times.

## 2014-04-18 NOTE — ED Notes (Signed)
Patient continues to not respond to staff when being spoken to or physically stimulated. Patient is in no acute distress with respirations even and unlabored.

## 2014-04-18 NOTE — BH Assessment (Signed)
Inpt recommended. Pt declined [facility] due to developmental disability. TTS seeking placement. Sent referrals to facilities with DD programing.  [name] [name]   [name], [place] Triage Specialist 04/18/2014 3:27 AM

## 2014-04-18 NOTE — ED Notes (Signed)
[employer] serving IVC paperwork

## 2014-04-18 NOTE — BH Assessment (Signed)
[facility] Assessment Progress Note  At 15:35 I [facility] (pager # [phone]).  She reports that pt has been tentatively accepted for admission to their facility and that they are holding a bed for the pt.  However, they must first know whether pt will be transferred under IVC, or as a voluntary patient with his mother, Audria NineDawn Brown [phone]), who is also his legal guardian, transporting.  If pt is under IVC, they will disclose the name of the accepting physician after receiving paperwork for review.  After staffing this with Olga CoasterKristen Reed, LCSW and with Dahlia ByesJosephine Onuoha, NP, it has been determined that pt both meets criteria for IVC and would require IVC, given that the mother lives in [place].  EDP Linwood DibblesJon Knapp, MD has initiated IVC and at 14:08 petition was [facility].  Service of the Findings and Custody Order is pending as of this writing.  These details have been discussed with Renaldo FiddlerPaige McLean, TS, and with the pt's nurse who agree to handle follow up.  Dean Canninghomas Elisabel Hanover, MA Triage Specialist 04/18/2014 @ 16:32

## 2014-04-18 NOTE — ED Notes (Signed)
Psychiatry attempted to speak with pt, but pt refused to answer, turning away to face the wall.

## 2014-04-18 NOTE — ED Notes (Signed)
Patient refused to wake for morning medications. He would not respond at all and appeared to be asleep. He has previously talked to the tech about breakfast. The psychiatrist is rounding this morning and could not get a response from the patient. Patient is breathing, respirations even and unlabored.
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
## 2014-04-18 NOTE — ED Notes (Signed)
Pt accepted to [facility]. Accepting physican Wardell Honourhomas Clay. Report can be given at [phone]. Called sheriff, regarding transportation schedule; waiting for return phone call.
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
## 2014-04-18 NOTE — Progress Notes (Signed)
Patient ID: Dean Shepherd, male   DOB: 09/26/1995, 18 y.o.   MRN: [id number] This writer attempted to talk to patient who is awake and alert.  Patient did not want to participate in the interview and looked away.  We will try again later meanwhile patient is being monitored and his needs are met.      PMHNP-BC 

## 2014-04-18 NOTE — BH Assessment (Addendum)
[facility] (pager [phone]). She requests psychological eval, IQ score. Writer reports she will fax the requested info as soon as possible. Writer then [facility]   Evette Cristalaroline Paige Koran Seabrook, [place] Assessment Counselor

## 2014-04-19 NOTE — ED Notes (Signed)
Please see paper chart between 0045 and 0215.

## 2014-04-20 LAB — LAMOTRIGINE LEVEL: LAMOTRIGINE LVL: 4.5 ug/mL (ref 4.0–18.0)

## 2014-05-31 ENCOUNTER — Emergency Department (HOSPITAL_COMMUNITY)
Admission: EM | Admit: 2014-05-31 | Discharge: 2014-05-31 | Disposition: A | Discharge status: Home / Self Care | Payer: Medicaid Other | Attending: Emergency Medicine | Admitting: Emergency Medicine

## 2014-05-31 ENCOUNTER — Encounter (HOSPITAL_COMMUNITY): Payer: Self-pay | Admitting: *Deleted

## 2014-05-31 ENCOUNTER — Emergency Department (HOSPITAL_COMMUNITY): Payer: Medicaid Other

## 2014-05-31 DIAGNOSIS — Y92320 Baseball field as the place of occurrence of the external cause: Secondary | ICD-10-CM | POA: Insufficient documentation

## 2014-05-31 DIAGNOSIS — I10 Essential (primary) hypertension: Secondary | ICD-10-CM | POA: Diagnosis not present

## 2014-05-31 DIAGNOSIS — F909 Attention-deficit hyperactivity disorder, unspecified type: Secondary | ICD-10-CM | POA: Diagnosis not present

## 2014-05-31 DIAGNOSIS — W1839XA Other fall on same level, initial encounter: Secondary | ICD-10-CM | POA: Insufficient documentation

## 2014-05-31 DIAGNOSIS — Y998 Other external cause status: Secondary | ICD-10-CM | POA: Diagnosis not present

## 2014-05-31 DIAGNOSIS — Z79899 Other long term (current) drug therapy: Secondary | ICD-10-CM | POA: Insufficient documentation

## 2014-05-31 DIAGNOSIS — Z91040 Latex allergy status: Secondary | ICD-10-CM | POA: Insufficient documentation

## 2014-05-31 DIAGNOSIS — S4992XA Unspecified injury of left shoulder and upper arm, initial encounter: Secondary | ICD-10-CM | POA: Diagnosis not present

## 2014-05-31 DIAGNOSIS — Y9364 Activity, baseball: Secondary | ICD-10-CM | POA: Diagnosis not present

## 2014-05-31 DIAGNOSIS — Z8669 Personal history of other diseases of the nervous system and sense organs: Secondary | ICD-10-CM | POA: Diagnosis not present

## 2014-05-31 MED ORDER — IBUPROFEN 400 MG PO TABS: 400.0000 mg | ORAL_TABLET | Freq: Four times a day (QID) | ORAL | Status: DC | PRN

## 2014-05-31 NOTE — ED Notes (Signed)
Pt reports fall today while playing baseball.  Landed on his L shoulder. Pt able to move his arm without difficulty.

## 2014-05-31 NOTE — Discharge Instructions (Signed)
Cryotherapy °Cryotherapy means treatment with cold. Ice or gel packs can be used to reduce both pain and swelling. Ice is the most helpful within the first 24 to 48 hours after an injury or flare-up from overusing a muscle or joint. Sprains, strains, spasms, burning pain, shooting pain, and aches can all be eased with ice. Ice can also be used when recovering from surgery. Ice is effective, has very few side effects, and is safe for most people to use. °PRECAUTIONS  °Ice is not a safe treatment option for people with: °· Raynaud phenomenon. This is a condition affecting small blood vessels in the extremities. Exposure to cold may cause your problems to return. °· Cold hypersensitivity. There are many forms of cold hypersensitivity, including: °¨ Cold urticaria. Red, itchy hives appear on the skin when the tissues begin to warm after being iced. °¨ Cold erythema. This is a red, itchy rash caused by exposure to cold. °¨ Cold hemoglobinuria. Red blood cells break down when the tissues begin to warm after being iced. The hemoglobin that carry oxygen are passed into the urine because they cannot combine with blood proteins fast enough. °· Numbness or altered sensitivity in the area being iced. °If you have any of the following conditions, do not use ice until you have discussed cryotherapy with your caregiver: °· Heart conditions, such as arrhythmia, angina, or chronic heart disease. °· High blood pressure. °· Healing wounds or open skin in the area being iced. °· Current infections. °· Rheumatoid arthritis. °· Poor circulation. °· Diabetes. °Ice slows the blood flow in the region it is applied. This is beneficial when trying to stop inflamed tissues from spreading irritating chemicals to surrounding tissues. However, if you expose your skin to cold temperatures for too long or without the proper protection, you can damage your skin or nerves. Watch for signs of skin damage due to cold. °HOME CARE INSTRUCTIONS °Follow  these tips to use ice and cold packs safely. °· Place a dry or damp towel between the ice and skin. A damp towel will cool the skin more quickly, so you may need to shorten the time that the ice is used. °· For a more rapid response, add gentle compression to the ice. °· Ice for no more than 10 to 20 minutes at a time. The bonier the area you are icing, the less time it will take to get the benefits of ice. °· Check your skin after 5 minutes to make sure there are no signs of a poor response to cold or skin damage. °· Rest 20 minutes or more between uses. °· Once your skin is numb, you can end your treatment. You can test numbness by very lightly touching your skin. The touch should be so light that you do not see the skin dimple from the pressure of your fingertip. When using ice, most people will feel these normal sensations in this order: cold, burning, aching, and numbness. °· Do not use ice on someone who cannot communicate their responses to pain, such as small children or people with dementia. °HOW TO MAKE AN ICE PACK °Ice packs are the most common way to use ice therapy. Other methods include ice massage, ice baths, and cryosprays. Muscle creams that cause a cold, tingly feeling do not offer the same benefits that ice offers and should not be used as a substitute unless recommended by your caregiver. °To make an ice pack, do one of the following: °· Place crushed ice or a   bag of frozen vegetables in a sealable plastic bag. Squeeze out the excess air. Place this bag inside another plastic bag. Slide the bag into a pillowcase or place a damp towel between your skin and the bag. °· Mix 3 parts water with 1 part rubbing alcohol. Freeze the mixture in a sealable plastic bag. When you remove the mixture from the freezer, it will be slushy. Squeeze out the excess air. Place this bag inside another plastic bag. Slide the bag into a pillowcase or place a damp towel between your skin and the bag. °SEEK MEDICAL CARE  IF: °· You develop white spots on your skin. This may give the skin a blotchy (mottled) appearance. °· Your skin turns blue or pale. °· Your skin becomes waxy or hard. °· Your swelling gets worse. °MAKE SURE YOU:  °· Understand these instructions. °· Will watch your condition. °· Will get help right away if you are not doing well or get worse. °Document Released: 10/21/2010 Document Revised: 07/11/2013 Document Reviewed: 10/21/2010 °Exit[employer]® Patient Information ©2015 [employer]. This information is not intended to replace advice given to you by your health care provider. Make sure you discuss any questions you have with your health care provider. ° °

## 2014-05-31 NOTE — ED Provider Notes (Signed)
CSN: [id number]     Arrival date & time 05/31/14  2144 History  This chart was scribed for non-physician practitioner Elpidio AnisShari Tzipora Mcinroy, PA-C working with Lorre NickAnthony Allen, MD by Annye AsaAnna Dorsett, ED Scribe. This patient was seen in room WTR8/WTR8 and the patient's care was started at 10:58 PM.    Chief Complaint  Patient presents with  . Fall  . Shoulder Injury   Patient is a 19 y.o. male presenting with fall and shoulder injury. The history is provided by the patient. No language interpreter was used.  Fall Pertinent negatives include no chest pain, no abdominal pain and no headaches.  Shoulder Injury Pertinent negatives include no chest pain, no abdominal pain and no headaches.    HPI Comments: Dean Shepherd is a 19 y.o. male who presents with caregiver to the Emergency Department complaining of left shoulder pain after a fall while playing baseball this morning. His pain increases with certain rotations of the shoulder but he is able to move his arm without difficulty. He denies neck pain, back pain.   PCP at [facility] (Ms Selena BattenKim).   Past Medical History  Diagnosis Date  . Hypertension   . Oppositional defiant behavior   . ADHD (attention deficit hyperactivity disorder)   . Hydrocephalus   . Mental retardation    Past Surgical History  Procedure Laterality Date  . Csf shunt    . Elbow surgery     No family history on file. History  Substance Use Topics  . Smoking status: Never Smoker   . Smokeless tobacco: Not on file  . Alcohol Use: No    Review of Systems  Cardiovascular: Negative for chest pain.  Gastrointestinal: Negative for abdominal pain.  Musculoskeletal: Positive for arthralgias (Left shoulder pain). Negative for neck pain.  Neurological: Negative for headaches.    Allergies  Latex  Home Medications   Prior to Admission medications   Medication Sig Start Date End Date Taking? Authorizing Provider  acetaminophen (TYLENOL) 325 MG tablet Take 650 mg by  mouth every 6 (six) hours as needed (for pain/headache).   Yes Historical Provider, MD  amantadine (SYMMETREL) 100 MG capsule Take 100 mg by mouth 2 (two) times daily.   Yes Historical Provider, MD  divalproex (DEPAKOTE ER) 250 MG 24 hr tablet Take 750 mg by mouth at bedtime.   Yes Historical Provider, MD  divalproex (DEPAKOTE ER) 500 MG 24 hr tablet Take 500 mg by mouth daily.   Yes Historical Provider, MD  FLUoxetine (PROZAC) 40 MG capsule Take 40 mg by mouth daily.   Yes Historical Provider, MD  lamoTRIgine (LAMICTAL) 100 MG tablet Take 100 mg by mouth daily.   Yes Historical Provider, MD  OLANZapine (ZYPREXA) 15 MG tablet Take 15 mg by mouth at bedtime.   Yes Historical Provider, MD  propranolol (INDERAL) 10 MG tablet Take 10 mg by mouth 2 (two) times daily.   Yes Historical Provider, MD   BP 136/73 mmHg  Pulse 84  Temp(Src) 97.6 F (36.4 C) (Oral)  Resp 18  SpO2 99% Physical Exam  Constitutional: He is oriented to person, place, and time. He appears well-developed and well-nourished.  HENT:  Head: Normocephalic and atraumatic.  Neck: No tracheal deviation present.  Cardiovascular: Normal rate.   Pulmonary/Chest: Effort normal. He exhibits no tenderness.  Musculoskeletal: Normal range of motion. He exhibits no edema.  Left shoulder; no swelling, discoloration, or bony deformities. Full ROM, full strength. No midline cervical or paracervical tenderness.   Neurological:  He is alert and oriented to person, place, and time.  Skin: Skin is warm and dry.  Psychiatric: He has a normal mood and affect. His behavior is normal.  Nursing note and vitals reviewed.   ED Course  Procedures   DIAGNOSTIC STUDIES: Oxygen Saturation is 99% on RA, normal by my interpretation.    COORDINATION OF CARE: 11:02 PM Discussed treatment plan with caregiver at bedside and caregiver agreed to plan.  Labs Review Labs Reviewed - No data to display  Imaging Review Dg Shoulder Left  05/31/2014    CLINICAL DATA:  Fall, shoulder injury today  EXAM: LEFT SHOULDER - 2+ VIEW  COMPARISON:  None.  FINDINGS: Four views of the left shoulder submitted. No displaced fracture or subluxation. On frontal view of there is a subtle lucent line inferior aspect of acromion. Nondisplaced fracture cannot be excluded. Clinical correlation is necessary.  IMPRESSION: No displaced fracture or subluxation. On frontal view of there is a subtle lucent line inferior aspect of acromion. Nondisplaced fracture cannot be excluded. Clinical correlation is necessary.   Electronically Signed   By: Natasha Mead M.D.   On: 05/31/2014 22:54     EKG Interpretation None      MDM   Final diagnoses:  None  1. Left shoulder injury  Questionable linear lucency seen on one of 4 views on shoulder x-ray. The patient does not have any limitation to ROM, has minimal tenderness on exam. Doubtful for fracture based on exam. Supportive care discussed with patient caregiver.   I personally performed the services described in this documentation, which was scribed in my presence. The recorded information has been reviewed and is accurate.       Elpidio Anis, PA-C 06/01/14 0014  Lorre Nick, MD 06/01/14 [phone]

## 2014-06-26 ENCOUNTER — Emergency Department (HOSPITAL_COMMUNITY): Payer: Medicaid Other

## 2014-06-26 ENCOUNTER — Encounter (HOSPITAL_COMMUNITY): Payer: Self-pay | Admitting: Emergency Medicine

## 2014-06-26 ENCOUNTER — Emergency Department (HOSPITAL_COMMUNITY)
Admission: EM | Admit: 2014-06-26 | Discharge: 2014-06-27 | Disposition: A | Discharge status: Home / Self Care | Payer: Medicaid Other | Attending: Emergency Medicine | Admitting: Emergency Medicine

## 2014-06-26 DIAGNOSIS — I10 Essential (primary) hypertension: Secondary | ICD-10-CM | POA: Insufficient documentation

## 2014-06-26 DIAGNOSIS — Z79899 Other long term (current) drug therapy: Secondary | ICD-10-CM | POA: Insufficient documentation

## 2014-06-26 DIAGNOSIS — R112 Nausea with vomiting, unspecified: Secondary | ICD-10-CM | POA: Diagnosis not present

## 2014-06-26 DIAGNOSIS — F909 Attention-deficit hyperactivity disorder, unspecified type: Secondary | ICD-10-CM | POA: Insufficient documentation

## 2014-06-26 DIAGNOSIS — R1013 Epigastric pain: Secondary | ICD-10-CM | POA: Diagnosis not present

## 2014-06-26 DIAGNOSIS — R519 Headache, unspecified: Secondary | ICD-10-CM

## 2014-06-26 DIAGNOSIS — R51 Headache: Secondary | ICD-10-CM | POA: Diagnosis not present

## 2014-06-26 DIAGNOSIS — Z8669 Personal history of other diseases of the nervous system and sense organs: Secondary | ICD-10-CM | POA: Insufficient documentation

## 2014-06-26 DIAGNOSIS — F79 Unspecified intellectual disabilities: Secondary | ICD-10-CM | POA: Diagnosis not present

## 2014-06-26 DIAGNOSIS — Z91040 Latex allergy status: Secondary | ICD-10-CM | POA: Insufficient documentation

## 2014-06-26 DIAGNOSIS — F913 Oppositional defiant disorder: Secondary | ICD-10-CM | POA: Diagnosis not present

## 2014-06-26 DIAGNOSIS — F6089 Other specific personality disorders: Secondary | ICD-10-CM | POA: Diagnosis not present

## 2014-06-26 DIAGNOSIS — F6381 Intermittent explosive disorder: Secondary | ICD-10-CM | POA: Diagnosis not present

## 2014-06-26 DIAGNOSIS — F348 Other persistent mood [affective] disorders: Secondary | ICD-10-CM | POA: Diagnosis not present

## 2014-06-26 DIAGNOSIS — R109 Unspecified abdominal pain: Secondary | ICD-10-CM | POA: Diagnosis present

## 2014-06-26 DIAGNOSIS — F332 Major depressive disorder, recurrent severe without psychotic features: Secondary | ICD-10-CM | POA: Diagnosis not present

## 2014-06-26 LAB — COMPREHENSIVE METABOLIC PANEL
ALBUMIN: 4.2 g/dL (ref 3.5–5.2)
ALK PHOS: 90 U/L (ref 39–117)
ALT: 61 U/L — AB (ref 0–53)
AST: 48 U/L — AB (ref 0–37)
Anion gap: 6 (ref 5–15)
BILIRUBIN TOTAL: 0.5 mg/dL (ref 0.3–1.2)
BUN: 9 mg/dL (ref 6–23)
CHLORIDE: 105 mmol/L (ref 96–112)
CO2: 26 mmol/L (ref 19–32)
Calcium: 9 mg/dL (ref 8.4–10.5)
Creatinine, Ser: 0.86 mg/dL (ref 0.50–1.35)
GFR calc Af Amer: >90 mL/min (ref >90)
GFR calc non Af Amer: >90 mL/min (ref >90)
GLUCOSE: 108 mg/dL — AB (ref 70–99)
POTASSIUM: 3.8 mmol/L (ref 3.5–5.1)
Sodium: 137 mmol/L (ref 135–145)
TOTAL PROTEIN: 7.7 g/dL (ref 6.0–8.3)

## 2014-06-26 LAB — CBC WITH DIFFERENTIAL/PLATELET
BASOS ABS: 0 10*3/uL (ref 0.0–0.1)
Basophils Relative: 0 % (ref 0–1)
Eosinophils Absolute: 0.6 10*3/uL (ref 0.0–0.7)
Eosinophils Relative: 6 % — ABNORMAL HIGH (ref 0–5)
HCT: 41.2 % (ref 39.0–52.0)
HEMOGLOBIN: 13.7 g/dL (ref 13.0–17.0)
LYMPHS ABS: 3.6 10*3/uL (ref 0.7–4.0)
Lymphocytes Relative: 36 % (ref 12–46)
MCH: 27.3 pg (ref 26.0–34.0)
MCHC: 33.3 g/dL (ref 30.0–36.0)
MCV: 82.2 fL (ref 78.0–100.0)
Monocytes Absolute: 1.3 10*3/uL — ABNORMAL HIGH (ref 0.1–1.0)
Monocytes Relative: 13 % — ABNORMAL HIGH (ref 3–12)
NEUTROS ABS: 4.6 10*3/uL (ref 1.7–7.7)
NEUTROS PCT: 45 % (ref 43–77)
Platelets: 252 10*3/uL (ref 150–400)
RBC: 5.01 MIL/uL (ref 4.22–5.81)
RDW: 14.2 % (ref 11.5–15.5)
WBC: 10 10*3/uL (ref 4.0–10.5)

## 2014-06-26 LAB — LIPASE, BLOOD: LIPASE: 20 U/L (ref 11–59)

## 2014-06-26 MED ORDER — SODIUM CHLORIDE 0.9 % IV BOLUS (SEPSIS)
500.0000 mL | Freq: Once | INTRAVENOUS | Status: AC
  Administered 2014-06-26: 500 mL via INTRAVENOUS

## 2014-06-26 MED ORDER — ONDANSETRON HCL 4 MG/2ML IJ SOLN
4.0000 mg | Freq: Once | INTRAMUSCULAR | Status: AC
  Administered 2014-06-26: 4 mg via INTRAVENOUS
  Filled 2014-06-26: qty 2

## 2014-06-26 NOTE — ED Notes (Signed)
Pt comes from group home, where owner states pt has head shunt and has been had N/V with upset stomach. Owner denies in change in pt LOC.

## 2014-06-26 NOTE — ED Provider Notes (Signed)
CSN: [id number]     Arrival date & time 06/26/14  1957 History   First MD Initiated Contact with Patient 06/26/14 2258     Chief Complaint  Patient presents with  . Abdominal Pain  . Emesis     (Consider location/radiation/quality/duration/timing/severity/associated sxs/prior Treatment) HPI Comments: Patient presents to the emergency department for evaluation of headache with nausea and vomiting. Symptoms began after eating dinner tonight. 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 He has not had any loss of consciousness or decreased level of consciousness. There is no fever. Patient denies diarrhea. He does have some mild left upper abdominal discomfort associated with the symptoms.  Patient is a 19 y.o. male presenting with abdominal pain and vomiting.  Abdominal Pain Associated symptoms: nausea and vomiting   Emesis Associated symptoms: abdominal pain and headaches     Past Medical History  Diagnosis Date  . Hypertension   . Oppositional defiant behavior   . ADHD (attention deficit hyperactivity disorder)   . Hydrocephalus   . Mental retardation    Past Surgical History  Procedure Laterality Date  . Csf shunt    . Elbow surgery     History reviewed. No pertinent family history. History  Substance Use Topics  . Smoking status: Never Smoker   . Smokeless tobacco: Not on file  . Alcohol Use: No    Review of Systems  Gastrointestinal: Positive for nausea, vomiting and abdominal pain.  Neurological: Positive for headaches.  All other systems reviewed and are negative.     Allergies  Latex  Home Medications   Prior to Admission medications   Medication Sig Start Date End Date Taking? Authorizing Provider  amantadine (SYMMETREL) 100 MG capsule Take 100 mg by mouth 2 (two) times daily.   Yes Historical Provider, MD  divalproex (DEPAKOTE ER) 250 MG 24 hr tablet Take 750 mg by mouth at bedtime.   Yes Historical Provider, MD  divalproex (DEPAKOTE ER) 500 MG 24 hr  tablet Take 500 mg by mouth daily.   Yes Historical Provider, MD  FLUoxetine (PROZAC) 40 MG capsule Take 40 mg by mouth daily.   Yes Historical Provider, MD  lamoTRIgine (LAMICTAL) 100 MG tablet Take 100 mg by mouth daily.   Yes Historical Provider, MD  OLANZapine (ZYPREXA) 15 MG tablet Take 15 mg by mouth at bedtime.   Yes Historical Provider, MD  propranolol (INDERAL) 10 MG tablet Take 10 mg by mouth 2 (two) times daily.   Yes Historical Provider, MD  acetaminophen (TYLENOL) 325 MG tablet Take 650 mg by mouth every 6 (six) hours as needed (for pain/headache).    Historical Provider, MD  clonazePAM (KLONOPIN) 0.5 MG tablet Take 1 tablet (0.5 mg total) by mouth 2 (two) times daily as needed (agitation). 06/28/14   Earney NavyJosephine C Onuoha, NP  ibuprofen (ADVIL,MOTRIN) 400 MG tablet Take 1 tablet (400 mg total) by mouth every 6 (six) hours as needed. 05/31/14   Elpidio AnisShari Upstill, PA-C  ondansetron (ZOFRAN) 4 MG tablet Take 1 tablet (4 mg total) by mouth every 6 (six) hours. Patient not taking: Reported on 06/28/2014 06/27/14   Gilda Creasehristopher J Pollina, MD   BP 118/60 mmHg  Pulse 96  Temp(Src) 98 F (36.7 C) (Oral)  Resp 16  SpO2 100% Physical Exam  Constitutional: He is oriented to person, place, and time. He appears well-developed and well-nourished. No distress.  HENT:  Head: Normocephalic and atraumatic.  Right Ear: Hearing normal.  Left Ear: Hearing normal.  Nose: Nose normal.  Mouth/Throat: Oropharynx is clear and moist and mucous membranes are normal.  Eyes: Conjunctivae and EOM are normal. Pupils are equal, round, and reactive to light.  Neck: Normal range of motion. Neck supple.  Cardiovascular: Regular rhythm, S1 normal and S2 normal.  Exam reveals no gallop and no friction rub.   No murmur heard. Pulmonary/Chest: Effort normal and breath sounds normal. No respiratory distress. He exhibits no tenderness.  Abdominal: Soft. Normal appearance and bowel sounds are normal. There is no  hepatosplenomegaly. There is no tenderness. There is no rebound, no guarding, no tenderness at McBurney's point and negative Murphy's sign. No hernia.  Musculoskeletal: Normal range of motion.  Neurological: He is alert and oriented to person, place, and time. He has normal strength. No cranial nerve deficit or sensory deficit. Coordination normal. GCS eye subscore is 4. GCS verbal subscore is 5. GCS motor subscore is 6.  Extraocular muscle movement: normal No visual field cut Pupils: equal and reactive both direct and consensual response is normal No nystagmus present    Sensory function is intact to light touch, pinprick Proprioception intact  Grip strength 5/5 symmetric in upper extremities No pronator drift Normal finger to nose bilaterally  Lower extremity strength 5/5 against gravity      Skin: Skin is warm, dry and intact. No rash noted. No cyanosis.  Psychiatric: He has a normal mood and affect. His speech is normal and behavior is normal. Thought content normal.  Nursing note and vitals reviewed.   ED Course  Procedures (including critical care time) Labs Review Labs Reviewed  CBC WITH DIFFERENTIAL/PLATELET - Abnormal; Notable for the following:    Monocytes Relative 13 (*)    Monocytes Absolute 1.3 (*)    Eosinophils Relative 6 (*)    All other components within normal limits  COMPREHENSIVE METABOLIC PANEL - Abnormal; Notable for the following:    Glucose, Bld 108 (*)    AST 48 (*)    ALT 61 (*)    All other components within normal limits  LIPASE, BLOOD    Imaging Review No results found.   EKG Interpretation None      MDM   Final diagnoses:  Headache  Abdominal pain, epigastric  Nausea and vomiting, vomiting of unspecified type    Patient presented to the emergency department with complaints of headache, nausea, vomiting and abdominal pain. Patient has a history of VP shunt. Has not been any loss of consciousness. Patient does not have any focal  neurologic signs on examination. CT head was performed, no evidence of shunt failure. Patient does not have fever or signs of CNS infection.  Patient also complaining of abdominal pain with nausea and vomiting. He had mild epigastric tenderness. LFTs were very slightly elevated, ultrasound, however, was unremarkable. Patient treated symptomatically and discharged.    Gilda Crease, MD 06/30/14 [phone]

## 2014-06-27 ENCOUNTER — Emergency Department (EMERGENCY_DEPARTMENT_HOSPITAL)
Admission: EM | Admit: 2014-06-27 | Discharge: 2014-06-30 | Disposition: A | Discharge status: Home / Self Care | Payer: Medicaid Other | Source: Home / Self Care | Attending: Emergency Medicine | Admitting: Emergency Medicine

## 2014-06-27 ENCOUNTER — Emergency Department (HOSPITAL_COMMUNITY): Payer: Medicaid Other

## 2014-06-27 ENCOUNTER — Encounter (HOSPITAL_COMMUNITY): Payer: Self-pay | Admitting: Emergency Medicine

## 2014-06-27 DIAGNOSIS — F911 Conduct disorder, childhood-onset type: Secondary | ICD-10-CM

## 2014-06-27 DIAGNOSIS — Z8669 Personal history of other diseases of the nervous system and sense organs: Secondary | ICD-10-CM | POA: Insufficient documentation

## 2014-06-27 DIAGNOSIS — F909 Attention-deficit hyperactivity disorder, unspecified type: Secondary | ICD-10-CM

## 2014-06-27 DIAGNOSIS — Z91040 Latex allergy status: Secondary | ICD-10-CM | POA: Insufficient documentation

## 2014-06-27 DIAGNOSIS — I10 Essential (primary) hypertension: Secondary | ICD-10-CM | POA: Insufficient documentation

## 2014-06-27 DIAGNOSIS — F913 Oppositional defiant disorder: Secondary | ICD-10-CM

## 2014-06-27 DIAGNOSIS — F79 Unspecified intellectual disabilities: Secondary | ICD-10-CM | POA: Insufficient documentation

## 2014-06-27 DIAGNOSIS — F3481 Disruptive mood dysregulation disorder: Secondary | ICD-10-CM | POA: Diagnosis present

## 2014-06-27 DIAGNOSIS — Z79899 Other long term (current) drug therapy: Secondary | ICD-10-CM | POA: Insufficient documentation

## 2014-06-27 DIAGNOSIS — R4689 Other symptoms and signs involving appearance and behavior: Secondary | ICD-10-CM

## 2014-06-27 DIAGNOSIS — F6381 Intermittent explosive disorder: Secondary | ICD-10-CM | POA: Diagnosis present

## 2014-06-27 LAB — RAPID URINE DRUG SCREEN, HOSP PERFORMED
Amphetamines: NOT DETECTED
Barbiturates: NOT DETECTED
Benzodiazepines: NOT DETECTED
Cocaine: NOT DETECTED
Opiates: NOT DETECTED
Tetrahydrocannabinol: NOT DETECTED

## 2014-06-27 LAB — COMPREHENSIVE METABOLIC PANEL
ALT: 55 U/L — ABNORMAL HIGH (ref 0–53)
ANION GAP: 11 (ref 5–15)
AST: 44 U/L — ABNORMAL HIGH (ref 0–37)
Albumin: 4 g/dL (ref 3.5–5.2)
Alkaline Phosphatase: 85 U/L (ref 39–117)
BUN: 8 mg/dL (ref 6–23)
CHLORIDE: 105 mmol/L (ref 96–112)
CO2: 23 mmol/L (ref 19–32)
CREATININE: 0.97 mg/dL (ref 0.50–1.35)
Calcium: 9.7 mg/dL (ref 8.4–10.5)
Glucose, Bld: 118 mg/dL — ABNORMAL HIGH (ref 70–99)
Potassium: 3.7 mmol/L (ref 3.5–5.1)
Sodium: 139 mmol/L (ref 135–145)
Total Bilirubin: 0.5 mg/dL (ref 0.3–1.2)
Total Protein: 7.5 g/dL (ref 6.0–8.3)

## 2014-06-27 LAB — CBC
HCT: 39.9 % (ref 39.0–52.0)
Hemoglobin: 12.9 g/dL — ABNORMAL LOW (ref 13.0–17.0)
MCH: 26.7 pg (ref 26.0–34.0)
MCHC: 32.3 g/dL (ref 30.0–36.0)
MCV: 82.4 fL (ref 78.0–100.0)
Platelets: 257 10*3/uL (ref 150–400)
RBC: 4.84 MIL/uL (ref 4.22–5.81)
RDW: 14.2 % (ref 11.5–15.5)
WBC: 8.2 10*3/uL (ref 4.0–10.5)

## 2014-06-27 LAB — ACETAMINOPHEN LEVEL

## 2014-06-27 LAB — SALICYLATE LEVEL

## 2014-06-27 LAB — ETHANOL: Alcohol, Ethyl (B): <5 mg/dL (ref 0–9)

## 2014-06-27 MED ORDER — ONDANSETRON HCL 4 MG PO TABS: 4.0000 mg | ORAL_TABLET | Freq: Four times a day (QID) | ORAL | Status: DC
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
## 2014-06-27 NOTE — Discharge Instructions (Signed)

## 2014-06-27 NOTE — ED Notes (Signed)
Pt ambulating independently w/ steady gait on d/c in no acute distress, A&Ox4. D/c instructions reviewed w/ pt and caregiver - pt and caregiver deny any further questions or concerns at present. Rx given x1

## 2014-06-27 NOTE — BH Assessment (Addendum)
Tele Assessment Note   Dean Shepherd is an 19 y.o. male who is reported to have an Intellectual Disability (MR reported per hx) was brought into the [facility] today by the [employer] for medical examination but also, due to SI.  Pt reports that he "still feels suicidal."  In asking about his SI, pt reported that he "doesn't want to live anymore because there is too much going on."  When asked what is going on, pt reported that 1) his grandmother died in Aug 02, 2003 and he misses her very much, 2) his grandfather remarried and he does not like his grandfather's new wife and 3) he misses being away from his siblings.   Pt reports that sometimes when he is asleep he sees his grandmother sleeping beside him and although she is saying nice things, it makes him very sad.  Today, pt reports that during an outing with a day program staff member at [employer], pt had to go to the bathroom to have a bowel movement.  Pt stated that he was taking "a long time" and had to leave the bathroom before he felt he was finished.  Pt stated that the [employer] officers were at [employer] on another matter and the pt stated that they "were asked to help" with the pt.  Pt stated that he became scared that he was going to get in trouble and so he ran out of the library away from the [facility] worker and the [employer].  According to the pt, after a short run through the parking area and back into [employer], they caught up with the pt.  Pt stated he was out of breath and feeling physically ill, so the [employer] brought him to the [facility].  Once here, pt stated that he told staff that he was suicidal.  Pt stated that he has been sad "a long, long time."  Pt stated that his plan was to cut his wrist and arm with his fingernails to kill himself.  Pt stated that he has attempted to cut his wrists on 5-6 occasions. Pt denies HI and AVH.  When pt describes "seeing his grandmother lying beside me" he stated he was asleep.  Pt described symptoms of depression and anxiety over the last few  months in particular. Pt stated and [facility] staff confirmed that pt is getting adequate sleep nightly and eating well (no lose or gain.)  Per [facility] staff, pt is not IVC'd and mother is his guardian. Pt currently lives at the [facility] and attends the [facility].  Pt reports that he graduated from his special education high school last year and his [facility] staff reports he moved into his group home in 08/02/03.  Both report that the transition has generally gone well although, the [facility] staff reported that pt has been in the hospital 2 times for MH reasons during that time. [facility] staff reported that pt receives medication management services from [facility].  Per hx, pt has additional diagnoses of ADHD and ODD.  Pt denied any physical, emotional/verbal or sexual abuse.  Pt was dressed in scrubs and sitting on his hospital bed during the assessment. Pt maintained good eye contact, made some restless movements and spoke in an even, sometimes loud volume.  Pt's thought processes were coherent and relevant although judgement was impaired.  Lack of judgement may have been a combination of mental health issues and intellectual disability.  Pt's mood was anxious and sad and his blunted  affect was congruent.  Pt was oriented x 4.  Axis I: 311 Unspecified Depressive Disorder; 300.00 Unspecified Anxiety Disorder Axis II: Mental retardation, severity unknown per hx Axis III:  Past Medical History  Diagnosis Date  . Hypertension   . Oppositional defiant behavior   . ADHD (attention deficit hyperactivity disorder)   . Hydrocephalus   . Mental retardation    Axis IV: other psychosocial or environmental problems, problems related to social environment and problems with primary support group Axis V: 11-20 some danger of hurting self or others possible OR occasionally fails to maintain minimal personal hygiene OR gross impairment in communication  Past Medical History:  Past Medical History   Diagnosis Date  . Hypertension   . Oppositional defiant behavior   . ADHD (attention deficit hyperactivity disorder)   . Hydrocephalus   . Mental retardation     Past Surgical History  Procedure Laterality Date  . Csf shunt    . Elbow surgery      Family History: History reviewed. No pertinent family history.  Social History:  reports that he has never smoked. He does not have any smokeless tobacco history on file. He reports that he does not drink alcohol or use illicit drugs.  Additional Social History:  Alcohol / Drug Use Prescriptions: See PTA list History of alcohol / drug use?: No history of alcohol / drug abuse (pt denies)  CIWA: CIWA-Ar BP: (!) 139/50 mmHg Pulse Rate: 95 COWS:    PATIENT STRENGTHS: (choose at least two) Communication skills Supportive family/friends  Allergies:  Allergies  Allergen Reactions  . Latex Swelling    Home Medications:  (Not in a hospital admission)  OB/GYN Status:  No LMP for male patient.  General Assessment Data Location of Assessment: [facility] ED Is this a Tele or Face-to-Face Assessment?: Tele Assessment Is this an Initial Assessment or a Re-assessment for this encounter?: Initial Assessment Living Arrangements: Other (Comment) ([facility]) Can pt return to current living arrangement?: Yes Admission Status: Voluntary Is patient capable of signing voluntary admission?: No (Mother is Guardian) Transfer from: Other (Comment) [employer] downtown) Referral Source: Other Software engineer)  Medical Screening Exam [facility] Walk-in ONLY) Medical Exam completed: Yes  [facility] Crisis Care Plan Living Arrangements: Other (Comment) ([facility]) Name of Psychiatrist: [facility] Name of Therapist: none  Education Status Is patient currently in school?: No (graduated)  Risk to self with the past 6 months Suicidal Ideation: Yes-Currently Present Suicidal Intent: Yes-Currently Present Is patient at risk for suicide?:  Yes Suicidal Plan?: Yes-Currently Present Specify Current Suicidal Plan: stated he plans to cut his wrist with his fingernails Access to Means: Yes Specify Access to Suicidal Means: fingernails or other sharp object What has been your use of drugs/alcohol within the last 12 months?: none Previous Attempts/Gestures: Yes How many times?: 5 (pt estimate) Other Self Harm Risks:  (denies) Triggers for Past Attempts: Unpredictable Intentional Self Injurious Behavior:  (none noted) Family Suicide History: Unknown Recent stressful life event(s): Loss (Comment), Other (Comment) (death of GM in 17-Jul-2003; moving into Grp Home in 07-16-2013) Persecutory voices/beliefs?: No Depression: No Substance abuse history and/or treatment for substance abuse?: No Suicide prevention information given to non-admitted patients: Not applicable  Risk to Others within the past 6 months Homicidal Ideation: No (denies) Thoughts of Harm to Others: No (denies) Current Homicidal Intent: No Current Homicidal Plan: No Access to Homicidal Means: No Identified Victim: na History of harm to others?: No (pt denies; worker denies)  Assessment of Violence: None Noted Does patient have access to weapons?: No Criminal Charges Pending?: No Does patient have a court date: No  Psychosis Hallucinations: None noted (denies) Delusions: None noted  Mental Status Report Appearance/Hygiene: Disheveled, In scrubs, Unremarkable Eye Contact: Good Motor Activity: Restlessness, Unremarkable Speech: Logical/coherent, Loud Level of Consciousness: Alert Mood: Anxious, Sad, Pleasant Affect: Blunted Anxiety Level: Minimal Thought Processes: Coherent, Relevant Judgement: Impaired Orientation: Person, Place, Time, Situation Obsessive Compulsive Thoughts/Behaviors: Unable to Assess  Cognitive Functioning Concentration: Fair Memory: Recent Intact, Remote Intact IQ: Below Average (reported to be MR [facility]) Level of Function:   (unknown) Insight: Poor Impulse Control: Poor Appetite: Good Weight Loss: 0 Weight Gain: 0 Sleep: No Change Total Hours of Sleep: 8 Vegetative Symptoms: Unable to Assess  ADLScreening [facility] Assessment Services) Patient's cognitive ability adequate to safely complete daily activities?: Yes Patient able to express need for assistance with ADLs?: Yes Independently performs ADLs?:  (may need some assistance)  Prior Inpatient Therapy Prior Inpatient Therapy: Yes Prior Therapy Dates: 2015 [facility] Prior [facility] [place] Reason for Treatment: SI  Prior Outpatient Therapy Prior Outpatient Therapy: No  ADL Screening (condition at time of admission) Patient's cognitive ability adequate to safely complete daily activities?: Yes Patient able to express need for assistance with ADLs?: Yes Independently performs ADLs?:  (may need some assistance)       Abuse/Neglect Assessment (Assessment to be complete while patient is alone) Physical Abuse: Denies (pt denies) Verbal Abuse: Denies (pt denies) Sexual Abuse: Denies (pt denies)     Advance Directives (For Healthcare) Does patient have an advance directive?: No Would patient like information on creating an advanced directive?: No - patient declined information    Additional Information 1:1 In Past 12 Months?: No CIRT Risk: No Elopement Risk: Yes Does patient have medical clearance?: Yes     Disposition:  Disposition Initial Assessment Completed for this Encounter: Yes Disposition of Patient: Other dispositions (Pending review w/ [facility] Extender) Other disposition(s): Other (Comment)  Per Donell SievertSpencer Simon, PA:  Pt does not meet IP criteria for [facility].  Pt may be at baseline.  Recommendation is for psychiatry to re-evaluate him in the morning for possible discharge to grp home or if not, outside placement at that time.   [facility] to [facility] Adri[facility] Simasook [facility]:  Advised of Recommendation.  He  agreed. Spoke to Costco WholesaleKristine, Engineer, civil (consulting)nurse at [facility]: Advised of plan. She stated she will advise his nurse.  Beryle FlockMary Nahome Bublitz, MS, CRC, [facility] Triage Specialist [facility] Hammond T 06/27/2014 7:56 PM
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
## 2014-06-27 NOTE — ED Notes (Addendum)
Patient is unable to urinate at this time he will let us know when he has to go... Belongings are in locker 31

## 2014-06-28 DIAGNOSIS — F348 Other persistent mood [affective] disorders: Secondary | ICD-10-CM | POA: Diagnosis not present

## 2014-06-28 DIAGNOSIS — F332 Major depressive disorder, recurrent severe without psychotic features: Secondary | ICD-10-CM | POA: Diagnosis not present

## 2014-06-28 DIAGNOSIS — F913 Oppositional defiant disorder: Secondary | ICD-10-CM | POA: Diagnosis not present

## 2014-06-28 DIAGNOSIS — F6381 Intermittent explosive disorder: Secondary | ICD-10-CM | POA: Diagnosis not present

## 2014-06-28 DIAGNOSIS — F6089 Other specific personality disorders: Secondary | ICD-10-CM | POA: Diagnosis not present

## 2014-06-28 DIAGNOSIS — F79 Unspecified intellectual disabilities: Secondary | ICD-10-CM | POA: Diagnosis not present

## 2014-06-28 MED ORDER — PROPRANOLOL HCL 10 MG PO TABS
10.0000 mg | ORAL_TABLET | Freq: Two times a day (BID) | ORAL | Status: DC
  Administered 2014-06-28: 10 mg via ORAL
  Filled 2014-06-28 (×2): qty 1

## 2014-06-28 MED ORDER — CLONAZEPAM 0.5 MG PO TABS
0.5000 mg | ORAL_TABLET | Freq: Two times a day (BID) | ORAL | Status: DC | PRN
  Administered 2014-06-28: 0.5 mg via ORAL
  Filled 2014-06-28: qty 1

## 2014-06-28 MED ORDER — CLONAZEPAM 0.5 MG PO TABS: 0.5000 mg | ORAL_TABLET | Freq: Two times a day (BID) | ORAL | Status: DC | PRN

## 2014-06-28 MED ORDER — PROPRANOLOL HCL 20 MG PO TABS
20.0000 mg | ORAL_TABLET | Freq: Two times a day (BID) | ORAL | Status: DC
  Administered 2014-06-28 – 2014-06-29 (×2): 20 mg via ORAL
  Filled 2014-06-28 (×6): qty 1

## 2014-06-28 MED ORDER — FLUOXETINE HCL 20 MG PO CAPS
40.0000 mg | ORAL_CAPSULE | Freq: Every day | ORAL | Status: DC
  Administered 2014-06-28 – 2014-06-30 (×3): 40 mg via ORAL
  Filled 2014-06-28 (×3): qty 2

## 2014-06-28 MED ORDER — DIVALPROEX SODIUM ER 500 MG PO TB24
750.0000 mg | ORAL_TABLET | Freq: Every day | ORAL | Status: DC
  Administered 2014-06-28 – 2014-06-29 (×2): 750 mg via ORAL
  Filled 2014-06-28 (×4): qty 1

## 2014-06-28 MED ORDER — LAMOTRIGINE 100 MG PO TABS
100.0000 mg | ORAL_TABLET | Freq: Every day | ORAL | Status: DC
  Administered 2014-06-28 – 2014-06-30 (×3): 100 mg via ORAL
  Filled 2014-06-28 (×3): qty 1

## 2014-06-28 MED ORDER — OLANZAPINE 5 MG PO TABS
15.0000 mg | ORAL_TABLET | Freq: Every day | ORAL | Status: DC
  Administered 2014-06-28 – 2014-06-29 (×2): 15 mg via ORAL
  Filled 2014-06-28 (×4): qty 1

## 2014-06-28 MED ORDER — AMANTADINE HCL 100 MG PO CAPS
100.0000 mg | ORAL_CAPSULE | Freq: Two times a day (BID) | ORAL | Status: DC
Start: 2014-06-28 — End: 2014-06-30
  Administered 2014-06-28 – 2014-06-30 (×5): 100 mg via ORAL
  Filled 2014-06-28 (×8): qty 1

## 2014-06-28 NOTE — ED Notes (Signed)
Pt refused a shower. 

## 2014-06-28 NOTE — ED Notes (Signed)
[facility]. Pt refused to get OOB. Security, counselor and [employer] at bedside to explain and discuss with pt concerning d/c. Pt aggressive with [employer] and was placed in handcuffs.
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
## 2014-06-28 NOTE — ED Notes (Signed)
Resting quietly with eye closed. Easily arousable. Verbally responsive. Resp even and unlabored. ABC's intact. No behavior problems noted. NAD noted. Sitter at bedside.  

## 2014-06-28 NOTE — ED Notes (Signed)
Psych team at bedside .
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
## 2014-06-28 NOTE — ED Notes (Signed)
Pt requests to come out of hand cuffs, although less agitated, a verbal contract for pt to be cooperative and not by any circumstance show aggression to staff is reached to which pt agrees. Pt asking when he is going to [facility], to which I respond we do not have a confirmed date and time at this time.

## 2014-06-28 NOTE — ED Notes (Signed)
Awake. Verbally responsive. Resp even and unlabored. ABC's intact. Pt talked on telephone to family member and watching TV. No behavior problems noted. Pt refused breakfast this morning but did eat a sandwich and drink. Sitter at bedside.

## 2014-06-28 NOTE — Progress Notes (Signed)
CSW has received authorization number from [facility] : [id number].  [facility].  Trish MageBrittney Maven Varelas, LCSWA [phone] ED CSW 06/28/2014 7:39 PM

## 2014-06-28 NOTE — [facility] Suicide Risk Assessment (Cosign Needed)
Suicide Risk Assessment  Discharge Assessment   [facility]   Demographic Factors:  Male, Caucasian, Low socioeconomic status and Unemployed  Total Time spent with patient: 20 minutes  Musculoskeletal: Strength & Muscle Tone: within normal limits Gait & Station: normal Patient leans: N/A  Psychiatric Specialty Exam:     Blood pressure 125/61, pulse 83, temperature 97.7 F (36.5 C), temperature source Oral, resp. rate 20, SpO2 99 %.There is no height or weight on file to calculate BMI.  General Appearance: Casual and Fairly Groomed  Eye Contact::  Good  Speech:  Clear and Coherent and Normal Rate409  Volume:  Normal  Mood:  Anxious  Affect:  Congruent and Depressed  Thought Process:  Coherent and Goal Directed  Orientation:  Full (Time, Place, and Person)  Thought Content:  WDL  Suicidal Thoughts:  No  Homicidal Thoughts:  No  Memory:  Immediate;   Good Recent;   Fair Remote;   Fair  Judgement:  Fair  Insight:  Fair  Psychomotor Activity:  Normal  Concentration:  Fair  Recall:  NA  Fund of Knowledge:Good  Language: Good  Akathisia:  NA  Handed:  Right  AIMS (if indicated):     Assets:  Desire for Improvement  Sleep:     Cognition: Impaired,  Moderate  ADL's:  Intact      Has this patient used any form of tobacco in the last 30 days? (Cigarettes, Smokeless Tobacco, Cigars, and/or Pipes) Yes, A prescription for an FDA-approved tobacco cessation medication was offered at discharge and the patient refused  Mental Status Per Nursing Assessment::   On Admission:     Current Mental Status by Physician: NA  Loss Factors: NA  Historical Factors: NA  Risk Reduction Factors:   Living with another person, especially a relative  Continued Clinical Symptoms:  Bipolar Disorder:   Depressive phase Depression:   Insomnia  Cognitive Features That Contribute To Risk:  Polarized thinking    Suicide Risk:  Minimal: No identifiable  suicidal ideation.  Patients presenting with no risk factors but with morbid ruminations; may be classified as minimal risk based on the severity of the depressive symptoms  Principal Problem: <principal problem not specified> Discharge Diagnoses:  Patient Active Problem List   Diagnosis Date Noted  . Aggressive behavior [F60.89] 07/05/2013  . Physically aggressive behavior [F60.3] 07/05/2013      Plan Of Care/Follow-up recommendations:  Activity:  AS TOLERATED Diet:  REGULAR  Is patient on multiple antipsychotic therapies at discharge:  No   Has Patient had three or more failed trials of antipsychotic monotherapy by history:  No  Recommended Plan for Multiple Antipsychotic Therapies: NA    Alejo Beamer C   PMHNP-BC 06/28/2014, 2:30 PM

## 2014-06-28 NOTE — Progress Notes (Signed)
CSW has filled out the proper documentation for [facility] and diversion. CSW has contacted [facility] and faxed them the appropriate paperwork to try and obtain a diversion authorization number.  [facility] representative states that they will review the paper work and contact CSW. CSW will continue to follow up.  Trish MageBrittney Danya Spearman, LCSWA [phone] ED CSW 06/28/2014 5:14 PM
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
## 2014-06-29 DIAGNOSIS — F6381 Intermittent explosive disorder: Secondary | ICD-10-CM | POA: Diagnosis present

## 2014-06-29 DIAGNOSIS — F3481 Disruptive mood dysregulation disorder: Secondary | ICD-10-CM | POA: Diagnosis present

## 2014-06-29 LAB — URINALYSIS, ROUTINE W REFLEX MICROSCOPIC
Bilirubin Urine: NEGATIVE
Glucose, UA: NEGATIVE mg/dL
Hgb urine dipstick: NEGATIVE
Ketones, ur: NEGATIVE mg/dL
Leukocytes, UA: NEGATIVE
NITRITE: NEGATIVE
Protein, ur: NEGATIVE mg/dL
Specific Gravity, Urine: 1.028 (ref 1.005–1.030)
Urobilinogen, UA: 0.2 mg/dL (ref 0.0–1.0)
pH: 6 (ref 5.0–8.0)

## 2014-06-29 MED ORDER — ZIPRASIDONE MESYLATE 20 MG IM SOLR
10.0000 mg | Freq: Once | INTRAMUSCULAR | Status: AC
  Administered 2014-06-29: 10 mg via INTRAMUSCULAR
  Filled 2014-06-29: qty 20

## 2014-06-29 MED ORDER — LORAZEPAM 2 MG/ML IJ SOLN
1.0000 mg | Freq: Four times a day (QID) | INTRAMUSCULAR | Status: DC | PRN
  Administered 2014-06-29 – 2014-06-30 (×2): 1 mg via INTRAMUSCULAR
  Filled 2014-06-29 (×2): qty 1

## 2014-06-29 MED ORDER — STERILE WATER FOR INJECTION IJ SOLN
INTRAMUSCULAR | Status: AC
  Administered 2014-06-29: 17:00:00
  Filled 2014-06-29: qty 10

## 2014-06-29 MED ORDER — LORAZEPAM 0.5 MG PO TABS
0.5000 mg | ORAL_TABLET | Freq: Two times a day (BID) | ORAL | Status: DC
  Administered 2014-06-29 – 2014-06-30 (×3): 0.5 mg via ORAL
  Filled 2014-06-29 (×3): qty 1

## 2014-06-29 MED ORDER — DIPHENHYDRAMINE HCL 50 MG/ML IJ SOLN
25.0000 mg | Freq: Once | INTRAMUSCULAR | Status: AC
  Administered 2014-06-29: 25 mg via INTRAMUSCULAR
  Filled 2014-06-29: qty 1

## 2014-06-29 MED ORDER — LORAZEPAM 1 MG PO TABS
1.0000 mg | ORAL_TABLET | Freq: Four times a day (QID) | ORAL | Status: DC | PRN
Start: 2014-06-29 — End: 2014-06-30
  Administered 2014-06-30: 1 mg via ORAL
  Filled 2014-06-29: qty 1

## 2014-06-29 NOTE — ED Notes (Signed)
Patient requested security and police to be escorted to room 38. When asked why the patient states "you know I went off yesterday".

## 2014-06-29 NOTE — Progress Notes (Addendum)
Pt care coordinator Christean GriefDenise, [facility] are [facility] for [facility]. Per care coordinator and advocate patient associated [facility] with being able to see mom. Pt mom is legal guardian, however lives in [place]. [facility] however patient has so much anxiety regarding mother that if patient plans to see mother or has just seen mother it causes a melt down. Pt care cooridnator and advocate discussing with mother plan of no phone calls with mother while in the hospital setting in the ED or [facility]. CSW spoke with pt mother/guaridan who is in agreement with plan. CSW to discuss with nursing to try to arrange conversation with pt when it may be most therapeutic and less likely to cause more agitation and acuity. At [facility]   Olga CoasterKristen Aritha Huckeba, LCSW  Clinical Social Work  [facility] [phone]

## 2014-06-29 NOTE — Progress Notes (Signed)
[facility]  [facility] requesting IVC and UA.   Olga CoasterKristen Anna-Marie Coller, LCSW  Clinical Social Work  [facility] [phone]
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
## 2014-06-29 NOTE — ED Notes (Signed)
Patient laying down on mattress watching tv. No s/s of distress noted. Pt asking RN for chicken strips with his breakfast tray. RN informed pt that it was not available, pt accepting response. No inappropriate behaviors at this time.

## 2014-06-29 NOTE — Progress Notes (Signed)
[facility]. [employer] officer coming back to complete part B.   Olga CoasterKristen Jearldean Gutt, LCSW  Clinical Social Work  [facility] [phone]

## 2014-06-29 NOTE — ED Notes (Signed)
IVC papers delivered by [employer] 

## 2014-06-30 DIAGNOSIS — F6089 Other specific personality disorders: Secondary | ICD-10-CM

## 2014-06-30 DIAGNOSIS — F6381 Intermittent explosive disorder: Secondary | ICD-10-CM

## 2014-06-30 DIAGNOSIS — F348 Other persistent mood [affective] disorders: Secondary | ICD-10-CM

## 2014-06-30 DIAGNOSIS — F79 Unspecified intellectual disabilities: Secondary | ICD-10-CM

## 2014-06-30 MED ORDER — ZIPRASIDONE MESYLATE 20 MG IM SOLR
10.0000 mg | Freq: Once | INTRAMUSCULAR | Status: DC
  Filled 2014-06-30: qty 20

## 2014-06-30 MED ORDER — CLONAZEPAM 1 MG PO TABS: 1.0000 mg | ORAL_TABLET | Freq: Two times a day (BID) | ORAL | Status: DC | PRN

## 2014-06-30 MED ORDER — IBUPROFEN 200 MG PO TABS
200.0000 mg | ORAL_TABLET | Freq: Once | ORAL | Status: DC
Start: 2014-06-30 — End: 2014-06-30

## 2014-06-30 MED ORDER — DIPHENHYDRAMINE HCL 50 MG/ML IJ SOLN
50.0000 mg | Freq: Once | INTRAMUSCULAR | Status: AC
  Administered 2014-06-30: 50 mg via INTRAVENOUS

## 2014-06-30 MED ORDER — ZIPRASIDONE MESYLATE 20 MG IM SOLR
10.0000 mg | Freq: Once | INTRAMUSCULAR | Status: AC
  Administered 2014-06-30: 10 mg via INTRAMUSCULAR

## 2014-06-30 MED ORDER — DIPHENHYDRAMINE HCL 50 MG/ML IJ SOLN
50.0000 mg | Freq: Once | INTRAMUSCULAR | Status: DC
  Filled 2014-06-30: qty 1

## 2014-06-30 NOTE — Progress Notes (Signed)
CSW consulted with 1st shift CSW who states that the pt's group home should arrive around 6:00pm to pick up the pt upon discharge.  [name], LCSWA [phone] ED CSW. 06/30/2014 4:39 PM

## 2014-06-30 NOTE — ED Notes (Signed)
Group home here to transport pt back to group home, security and [employer] assisted group home reps to escort pt out to car via the ambulance bay

## 2014-06-30 NOTE — [facility] Suicide Risk Assessment (Signed)
Suicide Risk Assessment  Discharge Assessment   [facility]   Demographic Factors:  Male, Adolescent or young adult and Caucasian  Total Time spent with patient: 45 minutes  Musculoskeletal: Strength & Muscle Tone: within normal limits Gait & Station: normal Patient leans: N/A  Psychiatric Specialty Exam:     Blood pressure 100/56, pulse 77, temperature 97.9 F (36.6 C), temperature source Oral, resp. rate 16, SpO2 100 %.There is no height or weight on file to calculate BMI.  General Appearance: Casual  Eye Contact::  Good  Speech:  Normal Rate  Volume:  Normal  Mood:  Euthymic  Affect:  Blunt  Thought Process:  Coherent  Orientation:  Full (Time, Place, and Person)  Thought Content:  WDL  Suicidal Thoughts:  No  Homicidal Thoughts:  No  Memory:  Immediate;   Fair Recent;   Fair Remote;   Fair  Judgement:  Fair  Insight:  Lacking  Psychomotor Activity:  Normal  Concentration:  Fair  Recall:  F[employer]Fair  Fund of Knowledge:Fair  Language: Fair  Akathisia:  No  Handed:  Right  AIMS (if indicated):     Assets:  Health and safety inspectorinancial Resources/Insurance Housing Leisure Time Physical Health Resilience Social Support  ADL's:  Intact  Cognition: Impaired,  Moderate  Sleep:          Has this patient used any form of tobacco in the last 30 days? (Cigarettes, Smokeless Tobacco, Cigars, and/or Pipes) No  Mental Status Per Nursing Assessment::   On Admission:   Aggressive behaviors  Current Mental Status by Physician: NA  Loss Factors: NA  Historical Factors: Impulsivity  Risk Reduction Factors:   Sense of responsibility to family, Living with another person, especially a relative, Positive social support and Positive therapeutic relationship  Continued Clinical Symptoms:  None  Cognitive Features That Contribute To Risk:  None    Suicide Risk:  Minimal: No identifiable suicidal ideation.  Patients presenting with no risk factors but with  morbid ruminations; may be classified as minimal risk based on the severity of the depressive symptoms  Principal Problem: Aggressive behavior Discharge Diagnoses:  Patient Active Problem List   Diagnosis Date Noted  . Disruptive mood dysregulation disorder [F34.8] 06/29/2014    Priority: High  . Intermittent explosive disorder [F63.81] 06/29/2014    Priority: High  . Aggressive behavior [F60.89] 07/05/2013    Priority: High  . Mental retardation [F79]   . Oppositional defiant disorder [F91.3]   . Physically aggressive behavior [F60.3] 07/05/2013      Plan Of Care/Follow-up recommendations:  Activity:  as tolerated Diet:  heart healthy diet  Is patient on multiple antipsychotic therapies at discharge:  No   Has Patient had three or more failed trials of antipsychotic monotherapy by history:  No  Recommended Plan for Multiple Antipsychotic Therapies: NA    LORD, JAMISON, PMH-NP 06/30/2014, 11:07 AM

## 2014-06-30 NOTE — Consult Note (Signed)
[facility] Face-to-Face Psychiatry Consult   Reason for Consult:  Aggressive behaviors Referring Physician:  EDP Patient Identification: Dean Shepherd MRN:  [id number] Principal Diagnosis: Aggressive behavior Diagnosis:   Patient Active Problem List   Diagnosis Date Noted  . Disruptive mood dysregulation disorder [F34.8] 06/29/2014    Priority: High  . Intermittent explosive disorder [F63.81] 06/29/2014    Priority: High  . Aggressive behavior [F60.89] 07/05/2013    Priority: High  . Mental retardation [F79]   . Oppositional defiant disorder [F91.3]   . Physically aggressive behavior [F60.3] 07/05/2013    Total Time spent with patient: 45 minutes  Subjective:   Dean Shepherd is a 19 y.o. male patient does not warrant admission.  HPI:  The patient got upset with his roommate at his group home and ran away.  His mother and group home do not want him in a hospital since this is his baseline behavior and do not feel like he will benefit from hospitalization; IDD behaviors.  Denies suicidal/homicidal ideations, hallucinations, and alcohol/drug issues.  Stable for discharge back to his group home. HPI Elements:   Location:  generalized. Quality:  acute. Severity:  mild. Timing:  intermittent. Duration:  brief. Context:  upset with his roommate.  Past Medical History:  Past Medical History  Diagnosis Date  . Hypertension   . Oppositional defiant behavior   . ADHD (attention deficit hyperactivity disorder)   . Hydrocephalus   . Mental retardation     Past Surgical History  Procedure Laterality Date  . Csf shunt    . Elbow surgery     Family History: History reviewed. No pertinent family history. Social History:  History  Alcohol Use No     History  Drug Use No    History   Social History  . Marital Status: Single    Spouse Name: N/A  . Number of Children: N/A  . Years of Education: N/A   Social History Main Topics  . Smoking status: Never Smoker   . Smokeless tobacco: Not  on file  . Alcohol Use: No  . Drug Use: No  . Sexual Activity: Not on file   Other Topics Concern  . None   Social History Narrative   Additional Social History:    Prescriptions: See PTA list History of alcohol / drug use?: No history of alcohol / drug abuse (pt denies)                     Allergies:   Allergies  Allergen Reactions  . Latex Swelling    Labs:  Results for orders placed or performed during the hospital encounter of 06/27/14 (from the past 48 hour(s))  U/A (may I&O cath if menses)     Status: None   Collection Time: 06/29/14  9:34 AM  Result Value Ref Range   Color, Urine YELLOW YELLOW   APPearance CLEAR CLEAR   Specific Gravity, Urine 1.028 1.005 - 1.030   pH 6.0 5.0 - 8.0   Glucose, UA NEGATIVE NEGATIVE mg/dL   Hgb urine dipstick NEGATIVE NEGATIVE   Bilirubin Urine NEGATIVE NEGATIVE   Ketones, ur NEGATIVE NEGATIVE mg/dL   Protein, ur NEGATIVE NEGATIVE mg/dL   Urobilinogen, UA 0.2 0.0 - 1.0 mg/dL   Nitrite NEGATIVE NEGATIVE   Leukocytes, UA NEGATIVE NEGATIVE    Comment: MICROSCOPIC NOT DONE ON URINES WITH NEGATIVE PROTEIN, BLOOD, LEUKOCYTES, NITRITE, OR GLUCOSE <1000 mg/dL.    Vitals: Blood pressure 100/56, pulse 77, temperature 97.9 F (36.6 C),  temperature source Oral, resp. rate 16, SpO2 100 %.  Risk to Self: Suicidal Ideation: Yes-Currently Present Suicidal Intent: Yes-Currently Present Is patient at risk for suicide?: Yes Suicidal Plan?: Yes-Currently Present Specify Current Suicidal Plan: stated he plans to cut his wrist with his fingernails Access to Means: Yes Specify Access to Suicidal Means: fingernails or other sharp object What has been your use of drugs/alcohol within the last 12 months?: none How many times?: 5 (pt estimate) Other Self Harm Risks:  (denies) Triggers for Past Attempts: Unpredictable Intentional Self Injurious Behavior:  (none noted) Risk to Others: Homicidal Ideation: No (denies) Thoughts of Harm to  Others: No (denies) Current Homicidal Intent: No Current Homicidal Plan: No Access to Homicidal Means: No Identified Victim: na History of harm to others?: No (pt denies; worker denies) Assessment of Violence: None Noted Does patient have access to weapons?: No Criminal Charges Pending?: No Does patient have a court date: No Prior Inpatient Therapy: Prior Inpatient Therapy: Yes Prior Therapy Dates: 2015 (2 x IP per [facility] staff) Prior Therapy Facilty/Provider(s): [facility], [place] Reason for Treatment: SI Prior Outpatient Therapy: Prior Outpatient Therapy: No  Current Facility-Administered Medications  Medication Dose Route Frequency Provider Last Rate Last Dose  . amantadine (SYMMETREL) capsule 100 mg  100 mg Oral BID Zadie Rhine, MD   100 mg at 06/30/14 1017  . diphenhydrAMINE (BENADRYL) injection 50 mg  50 mg Intravenous Once Sutter Ahlgren      . divalproex (DEPAKOTE ER) 24 hr tablet 750 mg  750 mg Oral QHS Zadie Rhine, MD   750 mg at 06/29/14 2157  . FLUoxetine (PROZAC) capsule 40 mg  40 mg Oral Daily Zadie Rhine, MD   40 mg at 06/30/14 1017  . lamoTRIgine (LAMICTAL) tablet 100 mg  100 mg Oral Daily Zadie Rhine, MD   100 mg at 06/30/14 1017  . LORazepam (ATIVAN) tablet 1 mg  1 mg Oral Q6H PRN Earney Navy, NP   1 mg at 06/30/14 1017   Or  . LORazepam (ATIVAN) injection 1 mg  1 mg Intramuscular Q6H PRN Earney Navy, NP   1 mg at 06/29/14 1647  . LORazepam (ATIVAN) tablet 0.5 mg  0.5 mg Oral BID Velisa Regnier   0.5 mg at 06/30/14 1020  . OLANZapine (ZYPREXA) tablet 15 mg  15 mg Oral QHS Zadie Rhine, MD   15 mg at 06/29/14 2157  . propranolol (INDERAL) tablet 20 mg  20 mg Oral BID Elleigh Cassetta   Stopped at 06/30/14 1021  . ziprasidone (GEODON) injection 10 mg  10 mg Intramuscular Once Lalaine Overstreet       Current Outpatient Prescriptions  Medication Sig Dispense Refill  . acetaminophen (TYLENOL) 325 MG tablet Take 650 mg by mouth every 6 (six)  hours as needed (for pain/headache).    [name] amantadine (SYMMETREL) 100 MG capsule Take 100 mg by mouth 2 (two) times daily.    . divalproex (DEPAKOTE ER) 250 MG 24 hr tablet Take 750 mg by mouth at bedtime.    . divalproex (DEPAKOTE ER) 500 MG 24 hr tablet Take 500 mg by mouth daily.    [name] FLUoxetine (PROZAC) 40 MG capsule Take 40 mg by mouth daily.    [name] ibuprofen (ADVIL,MOTRIN) 400 MG tablet Take 1 tablet (400 mg total) by mouth every 6 (six) hours as needed. 30 tablet 0  . lamoTRIgine (LAMICTAL) 100 MG tablet Take 100 mg by mouth daily.    [name] OLANZapine (ZYPREXA) 15 MG tablet Take  15 mg by mouth at bedtime.    . propranolol (INDERAL) 10 MG tablet Take 10 mg by mouth 2 (two) times daily.    . clonazePAM (KLONOPIN) 0.5 MG tablet Take 1 tablet (0.5 mg total) by mouth 2 (two) times daily as needed (agitation). 30 tablet 0  . ondansetron (ZOFRAN) 4 MG tablet Take 1 tablet (4 mg total) by mouth every 6 (six) hours. (Patient not taking: Reported on 06/28/2014) 12 tablet 0    Musculoskeletal: Strength & Muscle Tone: within normal limits Gait & Station: normal Patient leans: N/A  Psychiatric Specialty Exam:     Blood pressure 100/56, pulse 77, temperature 97.9 F (36.6 C), temperature source Oral, resp. rate 16, SpO2 100 %.There is no height or weight on file to calculate BMI.  General Appearance: Casual  Eye Contact::  Good  Speech:  Normal Rate  Volume:  Normal  Mood:  Euthymic  Affect:  Blunt  Thought Process:  Coherent  Orientation:  Full (Time, Place, and Person)  Thought Content:  WDL  Suicidal Thoughts:  No  Homicidal Thoughts:  No  Memory:  Immediate;   Fair Recent;   Fair Remote;   Fair  Judgement:  Fair  Insight:  Lacking  Psychomotor Activity:  Normal  Concentration:  Fair  Recall:  FiservFair  Fund of Knowledge:Fair  Language: Fair  Akathisia:  No  Handed:  Right  AIMS (if indicated):     Assets:  Health and safety inspectorinancial Resources/Insurance Housing Leisure Time Physical  Health Resilience Social Support  ADL's:  Intact  Cognition: Impaired,  Moderate  Sleep:      Medical Decision Making: Review of Psycho-Social Stressors (1), Review or order clinical lab tests (1) and Review of Medication Regimen & Side Effects (2)  Treatment Plan Summary: Daily contact with patient to assess and evaluate symptoms and progress in treatment, Medication management and Plan discharge to group home  Plan:  No evidence of imminent risk to self or others at present.   Disposition: Discharge to group home  Nanine MeansLORD, JAMISON, PMH-NP 06/30/2014 11:01 AM Patient seen face-to-face for psychiatric evaluation, chart reviewed and case discussed with the physician extender and developed treatment plan. Reviewed the information documented and agree with the treatment plan. Thedore MinsMojeed Onesty Clair, MD

## 2014-06-30 NOTE — Progress Notes (Signed)
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 [facility] reached otu to [facility] care coordinators to discuss plan. Plan per care coordinator and advocate patient has manipulative behaviors and [facility] because mom has contacted patient there and come to see patient.   Plan for today is to have mother call patient and let him know that she [facility], and will not talk to patient until he is psychiatrically stable and back at group home. This was attempted to do last night, however patient had episode of physically aggressive behavior.

## 2014-07-03 ENCOUNTER — Emergency Department (HOSPITAL_COMMUNITY): Payer: Medicaid Other

## 2014-07-03 ENCOUNTER — Emergency Department (HOSPITAL_COMMUNITY)
Admission: EM | Admit: 2014-07-03 | Discharge: 2014-07-03 | Disposition: A | Discharge status: Home / Self Care | Payer: Medicaid Other | Attending: Emergency Medicine | Admitting: Emergency Medicine

## 2014-07-03 ENCOUNTER — Encounter (HOSPITAL_COMMUNITY): Payer: Self-pay

## 2014-07-03 DIAGNOSIS — R45851 Suicidal ideations: Secondary | ICD-10-CM | POA: Diagnosis not present

## 2014-07-03 DIAGNOSIS — I10 Essential (primary) hypertension: Secondary | ICD-10-CM | POA: Insufficient documentation

## 2014-07-03 DIAGNOSIS — R05 Cough: Secondary | ICD-10-CM | POA: Diagnosis present

## 2014-07-03 DIAGNOSIS — J069 Acute upper respiratory infection, unspecified: Secondary | ICD-10-CM | POA: Diagnosis not present

## 2014-07-03 DIAGNOSIS — Z8669 Personal history of other diseases of the nervous system and sense organs: Secondary | ICD-10-CM | POA: Insufficient documentation

## 2014-07-03 DIAGNOSIS — Z79899 Other long term (current) drug therapy: Secondary | ICD-10-CM | POA: Insufficient documentation

## 2014-07-03 DIAGNOSIS — Z91040 Latex allergy status: Secondary | ICD-10-CM | POA: Diagnosis not present

## 2014-07-03 LAB — I-STAT TROPONIN, ED: Troponin i, poc: 0 ng/mL (ref 0.00–0.08)

## 2014-07-03 LAB — ETHANOL: Alcohol, Ethyl (B): <5 mg/dL (ref 0–9)

## 2014-07-03 LAB — CBC
HEMATOCRIT: 42.3 % (ref 39.0–52.0)
HEMOGLOBIN: 13.6 g/dL (ref 13.0–17.0)
MCH: 26.7 pg (ref 26.0–34.0)
MCHC: 32.2 g/dL (ref 30.0–36.0)
MCV: 82.9 fL (ref 78.0–100.0)
Platelets: 248 10*3/uL (ref 150–400)
RBC: 5.1 MIL/uL (ref 4.22–5.81)
RDW: 14.4 % (ref 11.5–15.5)
WBC: 9.1 10*3/uL (ref 4.0–10.5)

## 2014-07-03 LAB — SALICYLATE LEVEL: Salicylate Lvl: <4 mg/dL (ref 2.8–20.0)

## 2014-07-03 LAB — BASIC METABOLIC PANEL
ANION GAP: 9 (ref 5–15)
BUN: 15 mg/dL (ref 6–23)
CO2: 26 mmol/L (ref 19–32)
Calcium: 9.3 mg/dL (ref 8.4–10.5)
Chloride: 107 mmol/L (ref 96–112)
Creatinine, Ser: 0.86 mg/dL (ref 0.50–1.35)
GLUCOSE: 102 mg/dL — AB (ref 70–99)
POTASSIUM: 4.3 mmol/L (ref 3.5–5.1)
Sodium: 142 mmol/L (ref 135–145)

## 2014-07-03 LAB — ACETAMINOPHEN LEVEL: Acetaminophen (Tylenol), Serum: <10 ug/mL — ABNORMAL LOW (ref 10–30)

## 2014-07-03 MED ORDER — DIPHENHYDRAMINE HCL 50 MG/ML IJ SOLN
25.0000 mg | Freq: Once | INTRAMUSCULAR | Status: AC
  Administered 2014-07-03: 25 mg via INTRAMUSCULAR
  Filled 2014-07-03: qty 1

## 2014-07-03 MED ORDER — HALOPERIDOL LACTATE 5 MG/ML IJ SOLN
5.0000 mg | Freq: Once | INTRAMUSCULAR | Status: AC
  Administered 2014-07-03: 5 mg via INTRAMUSCULAR
  Filled 2014-07-03: qty 1

## 2014-07-03 NOTE — ED Notes (Signed)
Vital signs never recorded from triage.

## 2014-07-03 NOTE — ED Notes (Addendum)
Report called to Oralia RudWilliam Craig with [facility]. Caregiver sts he would like to speak with a  Case manager when he arrives.

## 2014-07-03 NOTE — ED Notes (Signed)
Pt unable to give urine sample at this time 

## 2014-07-03 NOTE — ED Notes (Addendum)
[facility]. Pt is c/o shortness of breath for 2 or 3 hours and chest pain for approx one hour. Pt denied any chest trauma or injury but reports he has had a cough for a couple of days. Pt also reports he is feeling suicidal at this time.

## 2014-07-03 NOTE — ED Provider Notes (Addendum)
CSN: [id number]     Arrival date & time 07/03/14  1459 History   First MD Initiated Contact with Patient 07/03/14 1544     Chief Complaint  Patient presents with  . Suicidal  . Cough  . Chest Pain     (Consider location/radiation/quality/duration/timing/severity/associated sxs/prior Treatment) HPI 3 days. There has been no sputum production. No fever. The patient poor she feels generally achy all over. He reports sometimes he feels short of breath. He has had no syncope or lightheadedness. He has not tried to take anything for the cough. He does endorse some nasal congestion sore throat. The patient has also expressed suicidal thoughts to nursing staff. He has not expressing any acute suicidal thoughts to me or plan. Past Medical History  Diagnosis Date  . Hypertension   . Oppositional defiant behavior   . ADHD (attention deficit hyperactivity disorder)   . Hydrocephalus   . Mental retardation    Past Surgical History  Procedure Laterality Date  . Csf shunt    . Elbow surgery     No family history on file. History  Substance Use Topics  . Smoking status: Never Smoker   . Smokeless tobacco: Not on file  . Alcohol Use: No    Review of Systems 10 Systems reviewed and are negative for acute change except as noted in the HPI.    Allergies  Latex  Home Medications   Prior to Admission medications   Medication Sig Start Date End Date Taking? Authorizing Provider  acetaminophen (TYLENOL) 325 MG tablet Take 650 mg by mouth every 6 (six) hours as needed (for pain/headache).   Yes Historical Provider, MD  amantadine (SYMMETREL) 100 MG capsule Take 100 mg by mouth 2 (two) times daily.   Yes Historical Provider, MD  clonazePAM (KLONOPIN) 1 MG tablet Take 1 tablet (1 mg total) by mouth 2 (two) times daily as needed (agitation). 06/30/14  Yes Charm RingsJamison Y Lord, NP  divalproex (DEPAKOTE ER) 250 MG 24 hr tablet Take 750 mg by mouth at bedtime.   Yes Historical Provider, MD  divalproex  (DEPAKOTE ER) 500 MG 24 hr tablet Take 500 mg by mouth daily.   Yes Historical Provider, MD  FLUoxetine (PROZAC) 40 MG capsule Take 40 mg by mouth daily.   Yes Historical Provider, MD  ibuprofen (ADVIL,MOTRIN) 400 MG tablet Take 1 tablet (400 mg total) by mouth every 6 (six) hours as needed. 05/31/14  Yes Elpidio AnisShari Upstill, PA-C  lamoTRIgine (LAMICTAL) 100 MG tablet Take 100 mg by mouth daily.   Yes Historical Provider, MD  OLANZapine (ZYPREXA) 15 MG tablet Take 15 mg by mouth at bedtime.   Yes Historical Provider, MD  propranolol (INDERAL) 10 MG tablet Take 10 mg by mouth 2 (two) times daily.   Yes Historical Provider, MD   There were no vitals taken for this visit. Physical Exam  Constitutional: He is oriented to person, place, and time. He appears well-developed and well-nourished.  HENT:  Head: Normocephalic and atraumatic.  Eyes: EOM are normal. Pupils are equal, round, and reactive to light.  Neck: Neck supple.  Cardiovascular: Normal rate, regular rhythm, normal heart sounds and intact distal pulses.   Pulmonary/Chest: Effort normal and breath sounds normal.  Patient has no respiratory distress. He has occasional dry cough when he takes a deep breath. There is no wheezing or rail present.  Abdominal: Soft. Bowel sounds are normal. He exhibits no distension. There is no tenderness.  Musculoskeletal: Normal range of motion. He exhibits no  edema.  Neurological: He is alert and oriented to person, place, and time. He has normal strength. Coordination normal. GCS eye subscore is 4. GCS verbal subscore is 5. GCS motor subscore is 6.  Skin: Skin is warm, dry and intact.  Psychiatric: He has a normal mood and affect.    ED Course  Procedures (including critical care time) Labs Review Labs Reviewed  BASIC METABOLIC PANEL - Abnormal; Notable for the following:    Glucose, Bld 102 (*)    All other components within normal limits  ACETAMINOPHEN LEVEL - Abnormal; Notable for the following:     Acetaminophen (Tylenol), Serum <10.0 (*)    All other components within normal limits  CBC  ETHANOL  SALICYLATE LEVEL  I-STAT TROPOININ, ED    Imaging Review Dg Chest 2 View  07/03/2014   CLINICAL DATA:  Shortness of breath for 2-3 hours. Chest pain for 1 hour.  EXAM: CHEST  2 VIEW  COMPARISON:  PA and lateral chest 06/18/2013.  FINDINGS: Heart size and mediastinal contours are within normal limits. Both lungs are clear. Visualized skeletal structures are unremarkable. Ventriculostomy shunt catheter coursing down the left side the chest is noted.  IMPRESSION: No acute disease.   Electronically Signed   By: Drusilla Kanner M.D.   On: 07/03/2014 16:09     EKG Interpretation   Date/Time:  Monday July 03 2014 15:03:58 EDT Ventricular Rate:  86 PR Interval:  109 QRS Duration: 101 QT Interval:  355 QTC Calculation: 425 R Axis:   63 Text Interpretation:  Normal sinus rhythm Short PR interval no significant  change since April 2015 Confirmed by Criss Alvine  MD, SCOTT ([id number]) on  07/03/2014 3:06:53 PM      MDM   Final diagnoses:  URI (upper respiratory infection)   The patient is well in appearance. Chest x-ray does not show any abnormalities suggest pneumonia. EKG shows no acute changes. Findings are consistent with minor URI type symptoms. The patient has recurrent presentations for behavioral disorder and suicidal ideation. Today the patient's mood is bright and he is well-known appearance. He has stated suicidality to nursing staff however he has not expressed any thoughts or plans round suicide to myself. I do feel he can be safely returned to his group home for continued monitoring.    Arby Barrette, MD 07/03/14 1649  During the full course of my evaluation the patient was calm and appropriately interactive. He answered all questions without difficulty and showed no signs of behavior disorder at that time. At the time of discharge he became aggressive and hostile. Apparently at  that same time his group home manager had arrived to state that the patient was here because he has been aggressive towards other members of the group home for the past 3 days, intermittently swinging at staff. None of this had been noted prior to the plan for the patient's discharge for the stated cause of evaluation for cough and chest pain in the chief complaint. Apparently as per last episode at time of discharge and transport, the patient required sedation in order to be taked taken out of the emergency department. He was given Haldol 5 mg IM and Benadryl 25 mg IM for sedation for transport back to group home management.  Arby Barrette, MD 07/03/14 [phone]
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
## 2014-07-03 NOTE — Discharge Instructions (Signed)
Cough, Adult   A cough is a reflex. It helps you clear your throat and airways. A cough can help heal your body. A cough can last 2 or 3 weeks (acute) or may last more than 8 weeks (chronic). Some common causes of a cough can include an infection, allergy, or a cold.  HOME CARE  · Only take medicine as told by your doctor.  · If given, take your medicines (antibiotics) as told. Finish them even if you start to feel better.  · Use a cold steam vaporizer or humidifier in your home. This can help loosen thick spit (secretions).  · Sleep so you are almost sitting up (semi-upright). Use pillows to do this. This helps reduce coughing.  · Rest as needed.  · Stop smoking if you smoke.  GET HELP RIGHT AWAY IF:  · You have yellowish-white fluid (pus) in your thick spit.  · Your cough gets worse.  · Your medicine does not reduce coughing, and you are losing sleep.  · You cough up blood.  · You have trouble breathing.  · Your pain gets worse and medicine does not help.  · You have a fever.  MAKE SURE YOU:   · Understand these instructions.  · Will watch your condition.  · Will get help right away if you are not doing well or get worse.  Document Released: 11/07/2010 Document Revised: 07/11/2013 Document Reviewed: 11/07/2010  [employer]® Patient Information ©2015 [employer]. This information is not intended to replace advice given to you by your health care provider. Make sure you discuss any questions you have with your health care provider.

## 2014-07-03 NOTE — ED Notes (Signed)
Bed: [id number] Expected date:  Expected time:  Means of arrival:  Comments: Hold for [place]

## 2014-07-03 NOTE — Progress Notes (Addendum)
Pt with 6 [facility] ED visits in the last 6 months This encounter for chest pain Pt cleared medically per EDP

## 2014-08-09 ENCOUNTER — Encounter (HOSPITAL_COMMUNITY): Payer: Self-pay | Admitting: *Deleted

## 2014-08-09 ENCOUNTER — Emergency Department (HOSPITAL_COMMUNITY)
Admission: EM | Admit: 2014-08-09 | Discharge: 2014-08-10 | Disposition: A | Discharge status: Home / Self Care | Payer: Medicaid Other | Attending: Emergency Medicine | Admitting: Emergency Medicine

## 2014-08-09 DIAGNOSIS — Y998 Other external cause status: Secondary | ICD-10-CM | POA: Diagnosis not present

## 2014-08-09 DIAGNOSIS — W01198A Fall on same level from slipping, tripping and stumbling with subsequent striking against other object, initial encounter: Secondary | ICD-10-CM | POA: Diagnosis not present

## 2014-08-09 DIAGNOSIS — Y9389 Activity, other specified: Secondary | ICD-10-CM | POA: Insufficient documentation

## 2014-08-09 DIAGNOSIS — I10 Essential (primary) hypertension: Secondary | ICD-10-CM | POA: Diagnosis not present

## 2014-08-09 DIAGNOSIS — S01312A Laceration without foreign body of left ear, initial encounter: Secondary | ICD-10-CM | POA: Diagnosis not present

## 2014-08-09 DIAGNOSIS — Z91040 Latex allergy status: Secondary | ICD-10-CM | POA: Diagnosis not present

## 2014-08-09 DIAGNOSIS — Z23 Encounter for immunization: Secondary | ICD-10-CM | POA: Diagnosis not present

## 2014-08-09 DIAGNOSIS — Z79899 Other long term (current) drug therapy: Secondary | ICD-10-CM | POA: Diagnosis not present

## 2014-08-09 DIAGNOSIS — F909 Attention-deficit hyperactivity disorder, unspecified type: Secondary | ICD-10-CM | POA: Diagnosis not present

## 2014-08-09 DIAGNOSIS — Z982 Presence of cerebrospinal fluid drainage device: Secondary | ICD-10-CM | POA: Diagnosis not present

## 2014-08-09 DIAGNOSIS — Z8669 Personal history of other diseases of the nervous system and sense organs: Secondary | ICD-10-CM | POA: Diagnosis not present

## 2014-08-09 DIAGNOSIS — S01319A Laceration without foreign body of unspecified ear, initial encounter: Secondary | ICD-10-CM

## 2014-08-09 DIAGNOSIS — Y9289 Other specified places as the place of occurrence of the external cause: Secondary | ICD-10-CM | POA: Diagnosis not present

## 2014-08-09 MED ORDER — HYDROCODONE-ACETAMINOPHEN 5-325 MG PO TABS
1.0000 | ORAL_TABLET | Freq: Once | ORAL | Status: AC
  Administered 2014-08-10: 1 via ORAL
  Filled 2014-08-09: qty 1

## 2014-08-09 MED ORDER — TETANUS-DIPHTH-ACELL PERTUSSIS 5-2.5-18.5 LF-MCG/0.5 IM SUSP
0.5000 mL | Freq: Once | INTRAMUSCULAR | Status: AC
  Administered 2014-08-10: 0.5 mL via INTRAMUSCULAR
  Filled 2014-08-09: qty 0.5

## 2014-08-09 MED ORDER — LIDOCAINE HCL (PF) 1 % IJ SOLN
30.0000 mL | Freq: Once | INTRAMUSCULAR | Status: AC
  Administered 2014-08-10: 30 mL via INTRADERMAL
  Filled 2014-08-09: qty 30

## 2014-08-09 NOTE — ED Provider Notes (Signed)
CSN: [id number]     Arrival date & time 08/09/14  2153 History   First MD Initiated Contact with Patient 08/09/14 2341     Chief Complaint  Patient presents with  . Fall  . Otalgia     (Consider location/radiation/quality/duration/timing/severity/associated sxs/prior Treatment) HPI   Dean Shepherd is a 19 y.o. 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 Patient states that he slipped and fell while getting up in the night to use the restroom. He is not sure how he fell, he doesn't think he lost consciousness. Pain is moderate and exacerbated by movement and palpation, last tetanus shot is unknown. Patient denies cervicalgia, numbness, weakness, severe headache, change in vision. Patient has been ambulatory since the event without issue. He denies pain or decreased range of motion to major joints.  Past Medical History  Diagnosis Date  . Hypertension   . Oppositional defiant behavior   . ADHD (attention deficit hyperactivity disorder)   . Hydrocephalus   . Mental retardation    Past Surgical History  Procedure Laterality Date  . Csf shunt    . Elbow surgery     No family history on file. History  Substance Use Topics  . Smoking status: Never Smoker   . Smokeless tobacco: Not on file  . Alcohol Use: No    Review of Systems  10 systems reviewed and found to be negative, except as noted in the HPI.   Allergies  Latex  Home Medications   Prior to Admission medications   Medication Sig Start Date End Date Taking? Authorizing Provider  acetaminophen (TYLENOL) 325 MG tablet Take 650 mg by mouth every 6 (six) hours as needed (for pain/headache).    Historical Provider, MD  amantadine (SYMMETREL) 100 MG capsule Take 100 mg by mouth 2 (two) times daily.    Historical Provider, MD  clonazePAM (KLONOPIN) 1 MG tablet Take 1 tablet (1 mg total) by mouth 2  (two) times daily as needed (agitation). 06/30/14   Charm Rings, NP  divalproex (DEPAKOTE ER) 250 MG 24 hr tablet Take 750 mg by mouth at bedtime.    Historical Provider, MD  divalproex (DEPAKOTE ER) 500 MG 24 hr tablet Take 500 mg by mouth daily.    Historical Provider, MD  FLUoxetine (PROZAC) 40 MG capsule Take 40 mg by mouth daily.    Historical Provider, MD  ibuprofen (ADVIL,MOTRIN) 400 MG tablet Take 1 tablet (400 mg total) by mouth every 6 (six) hours as needed. 05/31/14   Elpidio Anis, PA-C  lamoTRIgine (LAMICTAL) 100 MG tablet Take 100 mg by mouth daily.    Historical Provider, MD  OLANZapine (ZYPREXA) 15 MG tablet Take 15 mg by mouth at bedtime.    Historical Provider, MD  propranolol (INDERAL) 10 MG tablet Take 10 mg by mouth 2 (two) times daily.    Historical Provider, MD   BP 127/75 mmHg  Pulse 66  Temp(Src) 97.6 F (36.4 C) (Oral)  Resp 16  SpO2 98% Physical Exam  Constitutional: He is oriented to person, place, and time. He appears well-developed and well-nourished. No distress.  HENT:  Head: Normocephalic.  Ears:  Mouth/Throat: Oropharynx is clear and moist.  Multiple lacerations/avulasions to left ear as diagrammed  Irregular cranial shape, no depressed skull fractures, no crepitance  Eyes: Conjunctivae and EOM are normal. Pupils are equal, round, and reactive to light.  Neck: Normal range of  motion. Neck supple.  No midline C-spine  tenderness to palpation or step-offs appreciated. Patient has full range of motion without pain.   Cardiovascular: Normal rate, regular rhythm and intact distal pulses.   Pulmonary/Chest: Effort normal and breath sounds normal. No stridor. No respiratory distress. He has no wheezes. He has no rales. He exhibits no tenderness.  Abdominal: Soft. Bowel sounds are normal. He exhibits no distension and no mass. There is no tenderness. There is no rebound and no guarding.  Musculoskeletal: Normal range of motion.  Neurological: He is alert  and oriented to person, place, and time.  Follows commands, Clear, goal oriented speech, Strength is 5 out of 5x4 extremities, patient ambulates with a coordinated in nonantalgic gait. Sensation is grossly intact.   Skin: Skin is warm.  Psychiatric: He has a normal mood and affect.  Nursing note and vitals reviewed.   9 ED Course  LACERATION REPAIR Date/Time: 08/10/2014 1:38 AM Performed by: Wynetta EmeryPISCIOTTA, [place] Brasington Authorized by: Wynetta EmeryPISCIOTTA, Schneider Warchol Consent: Verbal consent obtained. Risks and benefits: risks, benefits and alternatives were discussed Consent given by: patient Required items: required blood products, implants, devices, and special equipment available Patient identity confirmed: verbally with patient Time out: Immediately prior to procedure a "time out" was called to verify the correct patient, procedure, equipment, support staff and site/side marked as required. Body area: head/neck Location details: left ear Laceration length: 1.5 cm Foreign bodies: no foreign bodies Tendon involvement: none Nerve involvement: none Vascular damage: no Anesthesia: nerve block (Auricular block) Local anesthetic: lidocaine 1% without epinephrine Anesthetic total: 8 ml Patient sedated: no Preparation: Patient was prepped and draped in the usual sterile fashion. Irrigation solution: saline Irrigation method: syringe Amount of cleaning: extensive Debridement: extensive Degree of undermining: minimal Skin closure: Ethilon (6-0) Number of sutures: 4 Technique: simple (Comnination of simple interrupted and corner suture) Approximation: close Approximation difficulty: complex Dressing: antibiotic ointment, non-adhesive packing strip and gauze roll Patient tolerance: Patient tolerated the procedure well with no immediate complications   LACERATION REPAIR Date/Time: 08/10/2014 1:38 AM Performed by: Wynetta EmeryPISCIOTTA, Larri Yehle Authorized by: Wynetta EmeryPISCIOTTA, Stephaine Breshears Consent: Verbal consent obtained. Risks  and benefits: risks, benefits and alternatives were discussed Consent given by: patient Required items: required blood products, implants, devices, and special equipment available Patient identity confirmed: verbally with patient Time out: Immediately prior to procedure a "time out" was called to verify the correct patient, procedure, equipment, support staff and site/side marked as required. Body area: head/neck Location details: left ear Laceration length: 2.0 cm Foreign bodies: no foreign bodies Tendon involvement: none Nerve involvement: none Vascular damage: no Anesthesia: nerve block (Auricular block) Local anesthetic: lidocaine 1% without epinephrine Anesthetic total: 8 ml Patient sedated: no Preparation: Patient was prepped and draped in the usual sterile fashion. Irrigation solution: saline Irrigation method: syringe Amount of cleaning: extensive Debridement: extensive Degree of undermining: Moderate Skin closure: Ethilon (6-0) Number of sutures: 4 Technique: simple (Comnination of simple interrupted and corner suture) Approximation: close Approximation difficulty: complex Dressing: antibiotic ointment, non-adhesive packing strip and gauze roll Patient tolerance: Patient tolerated the procedure well with no immediate complications  Labs Review Labs Reviewed - No data to display  Imaging Review No results found.   EKG Interpretation None      MDM   Final diagnoses:  None    Filed Vitals:   08/09/14 2200 08/10/14 0013 08/10/14 0118 08/10/14 0120  BP: 127/75 129/76 128/87 128/87  Pulse: 66 89 97 69  Temp: 97.6 F (36.4 C)     TempSrc:  Oral     Resp: SpO2: 98% 100% 99% 98%    Medications  HYDROcodone-acetaminophen (NORCO/VICODIN) 5-325 MG per tablet 1 tablet (1 tablet Oral Given 08/10/14 0008)  Tdap (BOOSTRIX) injection 0.5 mL (0.5 mLs Intramuscular Given 08/10/14 0008)  lidocaine (PF) (XYLOCAINE) 1 % injection 30 mL (30 mLs Intradermal Given  08/10/14 0008)    Dean Shepherd is a pleasant 19 y.o. male presenting with multiple irregular lacerations and avulsions to left ear. Tetanus is updated, wound is cleaned, debrided and 2 lacerations are closed. Wound is dressed and advised patient's caregiver on wound care with pressure dressing to avoid cauliflower ear. Patient has shunt to left side of head, his neuro exam is nonfocal.    This is a shared visit with the attending physician who personally evaluated the patient and agrees with the care plan.   Evaluation does not show pathology that would require ongoing emergent intervention or inpatient treatment. Pt is hemodynamically stable and mentating appropriately. Discussed findings and plan with patient/guardian, who agrees with care plan. All questions answered. Return precautions discussed and outpatient follow up given.    Wynetta Emery, PA-C 08/10/14 0149  Mancel Bale, MD 08/10/14 [phone]

## 2014-08-09 NOTE — ED Notes (Signed)
Pt states he tripped getting out of the bed and fell and hit his left ear. Skin tear/laceration present to left ear.

## 2014-08-10 NOTE — ED Provider Notes (Signed)
  Face-to-face evaluation   History: Accidental fall getting out of bed with left ear injury. He has at his current baseline mental status.  Physical exam: Alert, calm, cooperative. Cranial deformity. No palpable ventricular shunt. Several lacerations of left pinna. One is on the outer helix and one is on the inner helix. There are also areas of abrasion without deep injury on another area of the inner helix.  Medical screening examination/treatment/procedure(s) were conducted as a shared visit with non-physician practitioner(s) and myself.  I personally evaluated the patient during the encounter  Mancel BaleElliott Macaela Presas, MD 08/10/14 [phone]

## 2014-08-10 NOTE — Discharge Instructions (Signed)
Keep wound dry and do not remove dressing for 24 hours if possible. After that, wash gently morning and night (every 12 hours) with soap and water. Use a topical antibiotic ointment and cover with a bandaid or gauze.   Apply anabiotic ointment to all lacerations and abrasions, then apply nonadherent wound dressing. Put gauze kind and in front of the ear, apply to clean around the head as shown to prevent cauliflower ear.   Do NOT use rubbing alcohol or hydrogen peroxide, do not soak the area   Present to your primary care doctor or the urgent care of your choice, or the ED for suture removal in 8-10 days.   Every attempt was made to remove foreign body (contaminants) from the wound.  However, there is always a chance that some may remain in the wound. This can  increase your risk of infection.   If you see signs of infection (warmth, redness, tenderness, pus, sharp increase in pain, fever, red streaking in the skin) immediately return to the emergency department.   After the wound heals fully, apply sunscreen for 6-12 months to minimize scarring.    Head Injury You have received a head injury. It does not appear serious at this time. Headaches and vomiting are common following head injury. It should be easy to awaken from sleeping. Sometimes it is necessary for you to stay in the emergency department for a while for observation. Sometimes admission to the hospital may be needed. After injuries such as yours, most problems occur within the first 24 hours, but side effects may occur up to 7-10 days after the injury. It is important for you to carefully monitor your condition and contact your health care provider or seek immediate medical care if there is a change in your condition. WHAT ARE THE TYPES OF HEAD INJURIES? Head injuries can be as minor as a bump. Some head injuries can be more severe. More severe head injuries include:  A jarring injury to the brain (concussion).  A bruise of the  brain (contusion). This mean there is bleeding in the brain that can cause swelling.  A cracked skull (skull fracture).  Bleeding in the brain that collects, clots, and forms a bump (hematoma). WHAT CAUSES A HEAD INJURY? A serious head injury is most likely to happen to someone who is in a car wreck and is not wearing a seat belt. Other causes of major head injuries include bicycle or motorcycle accidents, sports injuries, and falls. HOW ARE HEAD INJURIES DIAGNOSED? A complete history of the event leading to the injury and your current symptoms will be helpful in diagnosing head injuries. Many times, pictures of the brain, such as CT or MRI are needed to see the extent of the injury. Often, an overnight hospital stay is necessary for observation.  WHEN SHOULD I SEEK IMMEDIATE MEDICAL CARE?  You should get help right away if:  You have confusion or drowsiness.  You feel sick to your stomach (nauseous) or have continued, forceful vomiting.  You have dizziness or unsteadiness that is getting worse.  You have severe, continued headaches not relieved by medicine. Only take over-the-counter or prescription medicines for pain, fever, or discomfort as directed by your health care provider.  You do not have normal function of the arms or legs or are unable to walk.  You notice changes in the black spots in the center of the colored part of your eye (pupil).  You have a clear or bloody fluid coming from  your nose or ears.  You have a loss of vision. During the next 24 hours after the injury, you must stay with someone who can watch you for the warning signs. This person should contact local emergency services (911 in the [place] if you have seizures, you become unconscious, or you are unable to wake up. HOW CAN I PREVENT A HEAD INJURY IN THE FUTURE? The most important factor for preventing major head injuries is avoiding motor vehicle accidents. To minimize the potential for damage to your head, it  is crucial to wear seat belts while riding in motor vehicles. Wearing helmets while bike riding and playing collision sports (like football) is also helpful. Also, avoiding dangerous activities around the house will further help reduce your risk of head injury.  WHEN CAN I RETURN TO NORMAL ACTIVITIES AND ATHLETICS? You should be reevaluated by your health care provider before returning to these activities. If you have any of the following symptoms, you should not return to activities or contact sports until 1 week after the symptoms have stopped:  Persistent headache.  Dizziness or vertigo.  Poor attention and concentration.  Confusion.  Memory problems.  Nausea or vomiting.  Fatigue or tire easily.  Irritability.  Intolerant of bright lights or loud noises.  Anxiety or depression.  Disturbed sleep. MAKE SURE YOU:   Understand these instructions.  Will watch your condition.  Will get help right away if you are not doing well or get worse. Document Released: 02/24/2005 Document Revised: 03/01/2013 Document Reviewed: 11/01/2012 [facility] Patient Information 2015 [place], [place]. This information is not intended to replace advice given to you by your health care provider. Make sure you discuss any questions you have with your health care provider.  Laceration Care, Adult A laceration is a cut that goes through all layers of the skin. The cut goes into the tissue beneath the skin. HOME CARE For stitches (sutures) or staples:  Keep the cut clean and dry.  If you have a bandage (dressing), change it at least once a day. Change the bandage if it gets wet or dirty, or as told by your doctor.  Wash the cut with soap and water 2 times a day. Rinse the cut with water. Pat it dry with a clean towel.  Put a thin layer of medicated cream on the cut as told by your doctor.  You may shower after the first 24 hours. Do not soak the cut in water until the stitches are removed.  Only  take medicines as told by your doctor.  Have your stitches or staples removed as told by your doctor. For skin adhesive strips:  Keep the cut clean and dry.  Do not get the strips wet. You may take a bath, but be careful to keep the cut dry.  If the cut gets wet, pat it dry with a clean towel.  The strips will fall off on their own. Do not remove the strips that are still stuck to the cut. For wound glue:  You may shower or take baths. Do not soak or scrub the cut. Do not swim. Avoid heavy sweating until the glue falls off on its own. After a shower or bath, pat the cut dry with a clean towel.  Do not put medicine on your cut until the glue falls off.  If you have a bandage, do not put tape over the glue.  Avoid lots of sunlight or tanning lamps until the glue falls off. Put sunscreen on  the cut for the first year to reduce your scar.  The glue will fall off on its own. Do not pick at the glue. You may need a tetanus shot if:  You cannot remember when you had your last tetanus shot.  You have never had a tetanus shot. If you need a tetanus shot and you choose not to have one, you may get tetanus. Sickness from tetanus can be serious. GET HELP RIGHT AWAY IF:   Your pain does not get better with medicine.  Your arm, hand, leg, or foot loses feeling (numbness) or changes color.  Your cut is bleeding.  Your joint feels weak, or you cannot use your joint.  You have painful lumps on your body.  Your cut is red, puffy (swollen), or painful.  You have a red line on the skin near the cut.  You have yellowish-white fluid (pus) coming from the cut.  You have a fever.  You have a bad smell coming from the cut or bandage.  Your cut breaks open before or after stitches are removed.  You notice something coming out of the cut, such as wood or glass.  You cannot move a finger or toe. MAKE SURE YOU:   Understand these instructions.  Will watch your condition.  Will get  help right away if you are not doing well or get worse. Document Released: 08/13/2007 Document Revised: 05/19/2011 Document Reviewed: 08/20/2010 [facility] Patient Information 2015 [place], [place]. This information is not intended to replace advice given to you by your health care provider. Make sure you discuss any questions you have with your health care provider.   [name]

## 2014-08-21 ENCOUNTER — Emergency Department (HOSPITAL_COMMUNITY)
Admission: EM | Admit: 2014-08-21 | Discharge: 2014-08-21 | Disposition: A | Discharge status: Home / Self Care | Payer: Medicaid Other | Attending: Emergency Medicine | Admitting: Emergency Medicine

## 2014-08-21 ENCOUNTER — Encounter (HOSPITAL_COMMUNITY): Payer: Self-pay | Admitting: Emergency Medicine

## 2014-08-21 DIAGNOSIS — F79 Unspecified intellectual disabilities: Secondary | ICD-10-CM | POA: Diagnosis not present

## 2014-08-21 DIAGNOSIS — Z4802 Encounter for removal of sutures: Secondary | ICD-10-CM | POA: Diagnosis not present

## 2014-08-21 DIAGNOSIS — Z8669 Personal history of other diseases of the nervous system and sense organs: Secondary | ICD-10-CM | POA: Insufficient documentation

## 2014-08-21 DIAGNOSIS — F909 Attention-deficit hyperactivity disorder, unspecified type: Secondary | ICD-10-CM | POA: Diagnosis not present

## 2014-08-21 DIAGNOSIS — Z79899 Other long term (current) drug therapy: Secondary | ICD-10-CM | POA: Diagnosis not present

## 2014-08-21 DIAGNOSIS — I10 Essential (primary) hypertension: Secondary | ICD-10-CM | POA: Insufficient documentation

## 2014-08-21 DIAGNOSIS — Z91040 Latex allergy status: Secondary | ICD-10-CM | POA: Insufficient documentation

## 2014-08-21 NOTE — ED Notes (Signed)
Suture removal to the left upper ear after falling out of bed and hitting a wall jack and a plug.  Caregiver at bedside with the patient.

## 2014-08-21 NOTE — Discharge Instructions (Signed)

## 2014-08-21 NOTE — ED Provider Notes (Signed)
CSN: [id number]     Arrival date & time 08/21/14  4098 History  This chart was scribed for non-physician practitioner, Teressa Lower, NP, working with Jerelyn Scott, MD, by Ronney Lion, ED Scribe. This patient was seen in room TR08C/TR08C and the patient's care was started at 9:54 AM.    Chief Complaint  Patient presents with  . Suture / Staple Removal   The history is provided by the patient. No language interpreter was used.     HPI Comments: Dean Shepherd is a 19 y.o. male with a PMHx of mental retardation, who presents to the Emergency Department for a suture removal of stitches that were placed 7 days ago after patient accidentally fell while getting up to go to the bathroom one night. He states he has had no problems since his suture repair, but does mention that some stitches may have fallen out when he was tossing and turning in his sleep. He denies any pain.  Past Medical History  Diagnosis Date  . Hypertension   . Oppositional defiant behavior   . ADHD (attention deficit hyperactivity disorder)   . Hydrocephalus   . Mental retardation    Past Surgical History  Procedure Laterality Date  . Csf shunt    . Elbow surgery     No family history on file. History  Substance Use Topics  . Smoking status: Never Smoker   . Smokeless tobacco: Not on file  . Alcohol Use: No    Review of Systems  All other systems reviewed and are negative.   Allergies  Latex  Home Medications   Prior to Admission medications   Medication Sig Start Date End Date Taking? Authorizing Provider  acetaminophen (TYLENOL) 325 MG tablet Take 650 mg by mouth every 6 (six) hours as needed (for pain/headache).    Historical Provider, MD  amantadine (SYMMETREL) 100 MG capsule Take 100 mg by mouth 2 (two) times daily.    Historical Provider, MD  clonazePAM (KLONOPIN) 1 MG tablet Take 1 tablet (1 mg total) by mouth 2 (two) times daily as needed (agitation). 06/30/14   Charm Rings, NP  divalproex  (DEPAKOTE ER) 250 MG 24 hr tablet Take 750 mg by mouth at bedtime.    Historical Provider, MD  divalproex (DEPAKOTE ER) 500 MG 24 hr tablet Take 500 mg by mouth daily.    Historical Provider, MD  FLUoxetine (PROZAC) 40 MG capsule Take 40 mg by mouth daily.    Historical Provider, MD  ibuprofen (ADVIL,MOTRIN) 400 MG tablet Take 1 tablet (400 mg total) by mouth every 6 (six) hours as needed. 05/31/14   Elpidio Anis, PA-C  lamoTRIgine (LAMICTAL) 100 MG tablet Take 100 mg by mouth daily.    Historical Provider, MD  OLANZapine (ZYPREXA) 15 MG tablet Take 15 mg by mouth at bedtime.    Historical Provider, MD  propranolol (INDERAL) 10 MG tablet Take 10 mg by mouth 2 (two) times daily.    Historical Provider, MD   BP 131/65 mmHg  Pulse 77  Temp(Src) 97.7 F (36.5 C) (Oral)  Resp 16  Ht 6' (1.829 m)  Wt 190 lb 12.8 oz (86.546 kg)  BMI 25.87 kg/m2  SpO2 98% Physical Exam  Constitutional: He is oriented to person, place, and time. He appears well-developed and well-nourished. No distress.  HENT:  Head: Normocephalic and atraumatic.  Eyes: Conjunctivae and EOM are normal.  Neck: Neck supple. No tracheal deviation present.  Cardiovascular: Normal rate.   Pulmonary/Chest: Effort normal. No respiratory  distress.  Musculoskeletal: Normal range of motion.  Neurological: He is alert and oriented to person, place, and time.  Skin: Skin is warm and dry.  Well healing wound to the left ear. No drainage noted  Psychiatric: He has a normal mood and affect. His behavior is normal.  Nursing note and vitals reviewed.   ED Course  SUTURE REMOVAL Date/Time: 08/21/2014 10:45 AM Performed by: Teressa Lower Authorized by: Teressa Lower Consent: Verbal consent obtained. Consent given by: patient Patient identity confirmed: verbally with patient Body area: head/neck Location details: left ear Wound Appearance: clean Sutures Removed: 8 Facility: sutures placed in this facility Patient tolerance:  Patient tolerated the procedure well with no immediate complications   (including critical care time)  DIAGNOSTIC STUDIES: Oxygen Saturation is 98% on RA, normal by my interpretation.    COORDINATION OF CARE: 9:55 AM - All remaining sutures completely removed. Patient tolerated procedure well.    MDM   Final diagnoses:  Visit for suture removal   Sutures removed without any problem. No sign of infection  I personally performed the services described in this documentation, which was scribed in my presence. The recorded information has been reviewed and is accurate.     Teressa Lower, NP 08/21/14 1046  Jerelyn Scott, MD 08/21/14 1046

## 2014-12-12 ENCOUNTER — Emergency Department (HOSPITAL_COMMUNITY)
Admission: EM | Admit: 2014-12-12 | Discharge: 2014-12-13 | Disposition: A | Discharge status: Home / Self Care | Payer: Medicaid Other | Attending: Emergency Medicine | Admitting: Emergency Medicine

## 2014-12-12 DIAGNOSIS — F603 Borderline personality disorder: Secondary | ICD-10-CM | POA: Diagnosis not present

## 2014-12-12 DIAGNOSIS — F6381 Intermittent explosive disorder: Secondary | ICD-10-CM | POA: Diagnosis not present

## 2014-12-12 DIAGNOSIS — R4689 Other symptoms and signs involving appearance and behavior: Secondary | ICD-10-CM | POA: Diagnosis present

## 2014-12-12 DIAGNOSIS — R451 Restlessness and agitation: Secondary | ICD-10-CM | POA: Diagnosis present

## 2014-12-12 DIAGNOSIS — F911 Conduct disorder, childhood-onset type: Secondary | ICD-10-CM | POA: Diagnosis not present

## 2014-12-12 DIAGNOSIS — Z79899 Other long term (current) drug therapy: Secondary | ICD-10-CM | POA: Insufficient documentation

## 2014-12-12 DIAGNOSIS — F913 Oppositional defiant disorder: Secondary | ICD-10-CM | POA: Insufficient documentation

## 2014-12-12 DIAGNOSIS — Z91040 Latex allergy status: Secondary | ICD-10-CM | POA: Diagnosis not present

## 2014-12-12 DIAGNOSIS — F6089 Other specific personality disorders: Secondary | ICD-10-CM | POA: Diagnosis not present

## 2014-12-12 DIAGNOSIS — I10 Essential (primary) hypertension: Secondary | ICD-10-CM | POA: Insufficient documentation

## 2014-12-12 LAB — RAPID URINE DRUG SCREEN, HOSP PERFORMED
AMPHETAMINES: NOT DETECTED
BENZODIAZEPINES: NOT DETECTED
Barbiturates: NOT DETECTED
Cocaine: NOT DETECTED
OPIATES: NOT DETECTED
Tetrahydrocannabinol: NOT DETECTED

## 2014-12-12 LAB — COMPREHENSIVE METABOLIC PANEL
ALK PHOS: 71 U/L (ref 38–126)
ALT: 31 U/L (ref 17–63)
AST: 31 U/L (ref 15–41)
Albumin: 3.9 g/dL (ref 3.5–5.0)
Anion gap: 7 (ref 5–15)
BUN: 14 mg/dL (ref 6–20)
CALCIUM: 9.2 mg/dL (ref 8.9–10.3)
CHLORIDE: 108 mmol/L (ref 101–111)
CO2: 26 mmol/L (ref 22–32)
CREATININE: 0.96 mg/dL (ref 0.61–1.24)
Glucose, Bld: 134 mg/dL — ABNORMAL HIGH (ref 65–99)
Potassium: 3.7 mmol/L (ref 3.5–5.1)
Sodium: 141 mmol/L (ref 135–145)
Total Bilirubin: 0.5 mg/dL (ref 0.3–1.2)
Total Protein: 7.5 g/dL (ref 6.5–8.1)

## 2014-12-12 LAB — CBC WITH DIFFERENTIAL/PLATELET
BASOS ABS: 0 10*3/uL (ref 0.0–0.1)
Basophils Relative: 0 %
EOS ABS: 0.2 10*3/uL (ref 0.0–0.7)
EOS PCT: 2 %
HCT: 41.9 % (ref 39.0–52.0)
Hemoglobin: 13.6 g/dL (ref 13.0–17.0)
Lymphocytes Relative: 18 %
Lymphs Abs: 1.6 10*3/uL (ref 0.7–4.0)
MCH: 27.5 pg (ref 26.0–34.0)
MCHC: 32.5 g/dL (ref 30.0–36.0)
MCV: 84.6 fL (ref 78.0–100.0)
Monocytes Absolute: 1.4 10*3/uL — ABNORMAL HIGH (ref 0.1–1.0)
Monocytes Relative: 15 %
Neutro Abs: 5.9 10*3/uL (ref 1.7–7.7)
Neutrophils Relative %: 65 %
PLATELETS: 224 10*3/uL (ref 150–400)
RBC: 4.95 MIL/uL (ref 4.22–5.81)
RDW: 13.7 % (ref 11.5–15.5)
WBC: 9 10*3/uL (ref 4.0–10.5)

## 2014-12-12 LAB — VALPROIC ACID LEVEL: Valproic Acid Lvl: 68 ug/mL (ref 50.0–100.0)

## 2014-12-12 LAB — ETHANOL

## 2014-12-12 MED ORDER — ZIPRASIDONE MESYLATE 20 MG IM SOLR
10.0000 mg | Freq: Once | INTRAMUSCULAR | Status: AC
  Administered 2014-12-12: 10 mg via INTRAMUSCULAR

## 2014-12-12 MED ORDER — STERILE WATER FOR INJECTION IJ SOLN
INTRAMUSCULAR | Status: AC
  Administered 2014-12-12: 18:00:00
  Filled 2014-12-12: qty 10

## 2014-12-12 MED ORDER — ZIPRASIDONE MESYLATE 20 MG IM SOLR
INTRAMUSCULAR | Status: AC
  Filled 2014-12-12: qty 20

## 2014-12-12 MED ORDER — DIPHENHYDRAMINE HCL 50 MG/ML IJ SOLN
50.0000 mg | Freq: Once | INTRAMUSCULAR | Status: AC
  Administered 2014-12-12: 50 mg via INTRAMUSCULAR
  Filled 2014-12-12: qty 1

## 2014-12-12 MED ORDER — LORAZEPAM 2 MG/ML IJ SOLN
1.0000 mg | Freq: Once | INTRAMUSCULAR | Status: AC
  Administered 2014-12-12: 1 mg via INTRAMUSCULAR
  Filled 2014-12-12: qty 1

## 2014-12-12 MED ORDER — LORAZEPAM 1 MG PO TABS: 1.0000 mg | ORAL_TABLET | Freq: Once | ORAL | Status: AC

## 2014-12-12 NOTE — ED Notes (Signed)
Patient cooperative during handcuff removal and medication administration.  C/O nausea.

## 2014-12-12 NOTE — ED Notes (Signed)
RN sts she will draw Valproic acid blood level

## 2014-12-12 NOTE — ED Notes (Signed)
Resting quietly with eyes closed. Respirations even and unlabored
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
## 2014-12-12 NOTE — ED Notes (Signed)
Pt uncooperative at this time. Labs delayed.

## 2014-12-12 NOTE — ED Provider Notes (Signed)
CSN: [id number]     Arrival date & time 12/12/14  1652 History   First MD Initiated Contact with Patient 12/12/14 1703     No chief complaint on file.    (Consider location/radiation/quality/duration/timing/severity/associated sxs/prior Treatment) Patient is a 19 y.o. male presenting with mental health disorder. The history is provided by the patient. No language interpreter was used.  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  Past Medical History  Diagnosis Date  . Hypertension   . Oppositional defiant behavior   . ADHD (attention deficit hyperactivity disorder)   . Hydrocephalus   . Mental retardation    Past Surgical History  Procedure Laterality Date  . Csf shunt    . Elbow surgery     No family history on file. Social History  Substance Use Topics  . Smoking status: Never Smoker   . Smokeless tobacco: Not on file  . Alcohol Use: No    Review of Systems  Constitutional: Positive for irritability.  Psychiatric/Behavioral: Positive for agitation.  All other systems reviewed and are negative.     Allergies  Latex  Home Medications   Prior to Admission medications   Medication Sig Start Date End Date Taking? Authorizing Provider  acetaminophen (TYLENOL) 325 MG tablet Take 650 mg by mouth every 6 (six) hours as needed (for pain/headache).    Historical Provider, MD  amantadine (SYMMETREL) 100 MG capsule Take 100 mg by mouth 2 (two) times daily.    Historical Provider, MD  clonazePAM (KLONOPIN) 1 MG tablet Take 1 tablet (1 mg  total) by mouth 2 (two) times daily as needed (agitation). 06/30/14   Charm Rings, NP  divalproex (DEPAKOTE ER) 250 MG 24 hr tablet Take 750 mg by mouth at bedtime.    Historical Provider, MD  divalproex (DEPAKOTE ER) 500 MG 24 hr tablet Take 500 mg by mouth daily.    Historical Provider, MD  FLUoxetine (PROZAC) 40 MG capsule Take 40 mg by mouth daily.    Historical Provider, MD  ibuprofen (ADVIL,MOTRIN) 400 MG tablet Take 1 tablet (400 mg total) by mouth every 6 (six) hours as needed. 05/31/14   Elpidio Anis, PA-C  lamoTRIgine (LAMICTAL) 100 MG tablet Take 100 mg by mouth daily.    Historical Provider, MD  OLANZapine (ZYPREXA) 15 MG tablet Take 15 mg by mouth at bedtime.    Historical Provider, MD  propranolol (INDERAL) 10 MG tablet Take 10 mg by mouth 2 (two) times daily.    Historical Provider, MD   BP 134/60 mmHg  Pulse 114  Temp(Src) 97.9 F (36.6 C) (Oral)  Resp 16  SpO2 98% Physical Exam  Constitutional: He is oriented to person, place, and time. He appears well-developed and well-nourished.  HENT:  Head: Normocephalic and atraumatic.  Right Ear: External ear normal.  Left Ear: External ear normal.  Nose: Nose normal.  Mouth/Throat: Oropharynx is clear and moist.  Eyes: Conjunctivae and EOM are normal. Pupils are equal, round, and reactive to light.  Neck: Normal range of motion.  Cardiovascular: Normal rate and normal heart sounds.   Pulmonary/Chest: Effort normal.  Abdominal: He exhibits no distension.  Musculoskeletal: Normal range of motion.  Neurological: He is alert and oriented to person, place, and time.  Skin: Skin is warm.  Psychiatric: He has a normal mood and affect.  Nursing note and vitals reviewed.   ED Course  Procedures (including critical care time) Labs Review Labs Reviewed  COMPREHENSIVE METABOLIC PANEL - Abnormal; Notable for the following:    Glucose, Bld 134 (*)    All other components within normal limits  CBC WITH DIFFERENTIAL/PLATELET -  Abnormal; Notable for the following:    Monocytes Absolute 1.4 (*)    All other components within normal limits  ETHANOL  URINE RAPID DRUG SCREEN, HOSP PERFORMED  VALPROIC ACID LEVEL    Imaging Review No results found. I have personally reviewed and evaluated these images and lab results as part of my medical decision-making.   EKG Interpretation None      MDM   Final diagnoses:  Physically aggressive behavior    Pt to be held to see Psychiatrist in the am.      Elson Areas, PA-C 12/12/14 2211  Benjiman Core, MD 12/12/14 2356

## 2014-12-13 DIAGNOSIS — F603 Borderline personality disorder: Secondary | ICD-10-CM

## 2014-12-13 DIAGNOSIS — F6089 Other specific personality disorders: Secondary | ICD-10-CM

## 2014-12-13 DIAGNOSIS — F6381 Intermittent explosive disorder: Secondary | ICD-10-CM

## 2014-12-13 MED ORDER — LORAZEPAM 2 MG/ML IJ SOLN
INTRAMUSCULAR | Status: AC
  Filled 2014-12-13: qty 1

## 2014-12-13 MED ORDER — LORAZEPAM 1 MG PO TABS: 1.0000 mg | ORAL_TABLET | Freq: Once | ORAL | Status: AC

## 2014-12-13 MED ORDER — LORAZEPAM 2 MG/ML IJ SOLN
1.0000 mg | Freq: Once | INTRAMUSCULAR | Status: AC
  Administered 2014-12-13: 1 mg via INTRAMUSCULAR

## 2014-12-13 MED ORDER — ACETAMINOPHEN 325 MG PO TABS
650.0000 mg | ORAL_TABLET | Freq: Four times a day (QID) | ORAL | Status: DC | PRN
  Administered 2014-12-13: 650 mg via ORAL
  Filled 2014-12-13: qty 2

## 2014-12-13 NOTE — Progress Notes (Signed)
CSW left message with pt guardian, Raynald Kemp [phone]. CSW also called patient group home, [facility] [phone] and spoke with [facility]. [facility] stated that she will have patient resident Interior and spatial designer, Pedro Earls to call CSW back.   Olga Coaster, LCSW  Clinical Social Work  [employer] [phone]

## 2014-12-13 NOTE — ED Notes (Signed)
Psych MD at bedside
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
## 2014-12-13 NOTE — ED Notes (Signed)
Pt c/o H/A and requesting "head x-ray" and wanting something for H/A.  MD Criss Alvine made aware.

## 2014-12-13 NOTE — Progress Notes (Signed)
CSW received message from pt guardian, Dean Shepherd returning CSW call. CSW called back and had to leave another message. CSW spoke with pt care coordinator regarding patient case. Pt care cooridnator agrees with discharge back to group home as patient has long history of wanting to come to the hosptial when he doesn't get his way. CSW spoke with Dean Shepherd who is apart of [facility] who provides community services to pt, with day program, volunteering, and transportation. Per Ms. Dean Shepherd, a staff member close with Dean Shepherd resigned last Friday and patient has had trouble with this adjustment. CSW spoke with Dean Shepherd at [facility] who states that patient is welcome to return back to group home however needs to confirm time with Dean Shepherd, Interior and spatial designer. CSW awaiting return call.   Dean Coaster, LCSW  Clinical Social Work  [employer] [phone]
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
## 2014-12-13 NOTE — [facility] Counselor (Signed)
Disposition: Per Donell Sievert, PA, Pt to be re-evaluated by psychiatry in the morning. Pt is likely at baseline.  Dr Rubin Payor made aware of disposition.    Cyndie Mull, [facility] Triage Specialist

## 2014-12-27 ENCOUNTER — Emergency Department (HOSPITAL_COMMUNITY): Payer: Medicaid Other

## 2014-12-27 ENCOUNTER — Encounter (HOSPITAL_COMMUNITY): Payer: Self-pay

## 2014-12-27 ENCOUNTER — Emergency Department (HOSPITAL_COMMUNITY)
Admission: EM | Admit: 2014-12-27 | Discharge: 2014-12-27 | Disposition: A | Discharge status: Home / Self Care | Payer: Medicaid Other | Attending: Emergency Medicine | Admitting: Emergency Medicine

## 2014-12-27 DIAGNOSIS — R55 Syncope and collapse: Secondary | ICD-10-CM | POA: Diagnosis present

## 2014-12-27 DIAGNOSIS — I10 Essential (primary) hypertension: Secondary | ICD-10-CM | POA: Diagnosis not present

## 2014-12-27 DIAGNOSIS — Z91040 Latex allergy status: Secondary | ICD-10-CM | POA: Insufficient documentation

## 2014-12-27 DIAGNOSIS — Z79899 Other long term (current) drug therapy: Secondary | ICD-10-CM | POA: Diagnosis not present

## 2014-12-27 DIAGNOSIS — F909 Attention-deficit hyperactivity disorder, unspecified type: Secondary | ICD-10-CM | POA: Diagnosis not present

## 2014-12-27 LAB — CBG MONITORING, ED: GLUCOSE-CAPILLARY: 102 mg/dL — AB (ref 65–99)

## 2014-12-27 MED ORDER — SODIUM CHLORIDE 0.9 % IV BOLUS (SEPSIS)
1000.0000 mL | Freq: Once | INTRAVENOUS | Status: AC
  Administered 2014-12-27: 1000 mL via INTRAVENOUS

## 2014-12-27 MED ORDER — ONDANSETRON 4 MG PO TBDP
4.0000 mg | ORAL_TABLET | Freq: Once | ORAL | Status: AC
  Administered 2014-12-27: 4 mg via ORAL
  Filled 2014-12-27: qty 1

## 2014-12-27 NOTE — ED Notes (Signed)
Pt. Presents with complaint of syncopal episode in bathroom of [employer]. Pt. Syncope was unwitnessed. Pt. States he was trying to have a bowel movement. Pt. States he feels dizzy and has been having some pain at his shunt site. Pt. VSS.

## 2014-12-27 NOTE — ED Provider Notes (Signed)
CSN: [id number]     Arrival date & time 12/27/14  1509 History   First MD Initiated Contact with Patient 12/27/14 1511     Chief Complaint  Patient presents with  . Loss of Consciousness    HPI  Mr. Dean Shepherd is an 19 y.o. male with PMH of MR, ODD who presents to the ED via EMS for evaluation following a syncopal episode. Pt was at his volunteer job at the [employer] when he passed out in the bathroom while trying to have a bowel movement. States he was found by someone there who called the ambulance. He states he felt a little lightheaded prior to losing consciousness. States this is his first time passing out. Initially stated he felt back to normal but later in interview said he felt some difficulty breathing and requested a breathing treatment. Denies chest pain, N/V/D, fever, chills. States he had lunch and drank water earlier today.   Past Medical History  Diagnosis Date  . Hypertension   . Oppositional defiant behavior   . ADHD (attention deficit hyperactivity disorder)   . Hydrocephalus   . Mental retardation    Past Surgical History  Procedure Laterality Date  . Csf shunt    . Elbow surgery     No family history on file. Social History  Substance Use Topics  . Smoking status: Never Smoker   . Smokeless tobacco: None  . Alcohol Use: No    Review of Systems  All other systems reviewed and are negative.     Allergies  Latex  Home Medications   Prior to Admission medications   Medication Sig Start Date End Date Taking? Authorizing Provider  acetaminophen (TYLENOL) 325 MG tablet Take 650 mg by mouth every 6 (six) hours as needed (for pain/headache).   Yes Historical Provider, MD  amantadine (SYMMETREL) 100 MG capsule Take 100 mg by mouth 2 (two) times daily.   Yes Historical Provider, MD  clonazePAM (KLONOPIN) 1 MG tablet Take 1 tablet (1 mg total) by mouth 2 (two) times daily as needed (agitation). 06/30/14  Yes Charm RingsJamison Y Lord, NP  divalproex (DEPAKOTE ER) 250 MG 24  hr tablet Take 750 mg by mouth at bedtime.   Yes Historical Provider, MD  divalproex (DEPAKOTE ER) 500 MG 24 hr tablet Take 500 mg by mouth daily.   Yes Historical Provider, MD  FLUoxetine (PROZAC) 40 MG capsule Take 40 mg by mouth daily.   Yes Historical Provider, MD  ibuprofen (ADVIL,MOTRIN) 400 MG tablet Take 1 tablet (400 mg total) by mouth every 6 (six) hours as needed. 05/31/14  Yes Elpidio AnisShari Upstill, PA-C  lamoTRIgine (LAMICTAL) 100 MG tablet Take 100 mg by mouth daily.   Yes Historical Provider, MD  OLANZapine (ZYPREXA) 15 MG tablet Take 15 mg by mouth at bedtime.   Yes Historical Provider, MD  propranolol (INDERAL) 10 MG tablet Take 10 mg by mouth 2 (two) times daily.   Yes Historical Provider, MD   BP 135/75 mmHg  Pulse 78  Temp(Src) 97.9 F (36.6 C) (Oral)  Resp 16  SpO2 100% Physical Exam  Constitutional: He is oriented to person, place, and time.  HENT:  Right Ear: External ear normal.  Left Ear: External ear normal.  Nose: Nose normal.  Mouth/Throat: Oropharynx is clear and moist. No oropharyngeal exudate.  Eyes: Conjunctivae and EOM are normal. Pupils are equal, round, and reactive to light.  Neck: Normal range of motion. Neck supple.  Cardiovascular: Normal rate, regular rhythm, normal heart sounds  and intact distal pulses.   No murmur heard. Pulmonary/Chest: Effort normal and breath sounds normal. No respiratory distress. He has no wheezes.  Abdominal: Soft. Bowel sounds are normal. He exhibits no distension. There is no tenderness.  Musculoskeletal: Normal range of motion. He exhibits no edema.  Lymphadenopathy:    He has no cervical adenopathy.  Neurological: He is alert and oriented to person, place, and time. No cranial nerve deficit.  Skin: Skin is warm and dry.  Psychiatric: He has a normal mood and affect. His behavior is normal.  Nursing note and vitals reviewed.  Orthostatic VS for the past 24 hrs:  BP- Lying Pulse- Lying BP- Sitting Pulse- Sitting BP-  Standing at 0 minutes Pulse- Standing at 0 minutes  12/27/14 1541 134/72 mmHg 82 132/67 mmHg 102 122/46 mmHg 77       ED Course  Procedures (including critical care time) Labs Review Labs Reviewed  CBG MONITORING, ED - Abnormal; Notable for the following:    Glucose-Capillary 102 (*)    All other components within normal limits  POCT CBG (FASTING - GLUCOSE)-MANUAL ENTRY    Imaging Review Dg Chest 2 View  12/27/2014  CLINICAL DATA:  Syncopal episode today, shortness of Breath EXAM: CHEST - 2 VIEW COMPARISON:  07/03/2014 FINDINGS: Cardiac shadow is stable. The lungs are clear bilaterally. A ventriculoperitoneal shunt is noted on the left. A shunt catheter fragment is noted in the right upper quadrant. No focal infiltrate or sizable effusion is seen. IMPRESSION: No active disease. Electronically Signed   By: Alcide Clever M.D.   On: 12/27/2014 16:22   I have personally reviewed and evaluated these images and lab results as part of my medical decision-making.   EKG Interpretation   Date/Time:  Wednesday December 27 2014 15:14:39 EDT Ventricular Rate:  79 PR Interval:  122 QRS Duration: 108 QT Interval:  355 QTC Calculation: 407 R Axis:   65 Text Interpretation:  Sinus rhythm Normal ECG Confirmed by POLLINA  MD,  CHRISTOPHER [phone]) on 12/27/2014 3:17:14 PM      MDM   Final diagnoses:  Syncope, unspecified syncope type    Pt is young and relatively healthy. Based on description of the event sounds likely to be vasovagal syncope. Will get EKG especially given pt's multiple psychiatric medications. Will check CXR, BG, orthostatic vitals. Will give fluids. Clinically pt does not look to be working hard to breathe and his lungs sound clear bilaterally so there is no indication for a breathing treatment at this time. Dispo likely discharge back to his group home.   13:05 Pt resting comfortably in room. States he feels mildly nauseous but otherwise ready to go. Workup is  unremarkable so likely vasovagal. Will give zofran and discharge back to group home. Return precautions given.   Carlene Coria, PA-C 12/27/14 1709  Eber Hong, MD 12/30/14 1226

## 2014-12-27 NOTE — ED Notes (Signed)
Contact information for patient: [facility].  [phone].  William caretaker at group home [phone]. Cindi CarbonChris Chavis is group home manager [phone]).

## 2014-12-27 NOTE — Discharge Instructions (Signed)
You were seen in the emergency room for syncope (fainting). All of your tests came back normal so it was probably due to straining, which is known as vasovagal syncope. Return to the ER if you start repeatedly fainting, experiencing seizures, chest pain, or any other concerning symptoms.  Please obtain all of your results from medical records or have your doctors office obtain the results - share them with your doctor - you should be seen at your doctors office in the next 2 days. Call today to arrange your follow up. Take the medications as prescribed. Please review all of the medicines and only take them if you do not have an allergy to them. Please be aware that if you are taking birth control pills, taking other prescriptions, ESPECIALLY ANTIBIOTICS may make the birth control ineffective - if this is the case, either do not engage in sexual activity or use alternative methods of birth control such as condoms until you have finished the medicine and your family doctor says it is OK to restart them. If you are on a blood thinner such as COUMADIN, be aware that any other medicine that you take may cause the coumadin to either work too much, or not enough - you should have your coumadin level rechecked in next 7 days if this is the case.  ?  It is also a possibility that you have an allergic reaction to any of the medicines that you have been prescribed - Everybody reacts differently to medications and while MOST people have no trouble with most medicines, you may have a reaction such as nausea, vomiting, rash, swelling, shortness of breath. If this is the case, please stop taking the medicine immediately and contact your physician.  ?  You should return to the ER if you develop severe or worsening symptoms.    Syncope Syncope is a medical term for fainting or passing out. This means you lose consciousness and drop to the ground. People are generally unconscious for less than 5 minutes. You may have some  muscle twitches for up to 15 seconds before waking up and returning to normal. Syncope occurs more often in older adults, but it can happen to anyone. While most causes of syncope are not dangerous, syncope can be a sign of a serious medical problem. It is important to seek medical care.  CAUSES  Syncope is caused by a sudden drop in blood flow to the brain. The specific cause is often not determined. Factors that can bring on syncope include:  Taking medicines that lower blood pressure.  Sudden changes in posture, such as standing up quickly.  Taking more medicine than prescribed.  Standing in one place for too long.  Seizure disorders.  Dehydration and excessive exposure to heat.  Low blood sugar (hypoglycemia).  Straining to have a bowel movement.  Heart disease, irregular heartbeat, or other circulatory problems.  Fear, emotional distress, seeing blood, or severe pain. SYMPTOMS  Right before fainting, you may:  Feel dizzy or light-headed.  Feel nauseous.  See all white or all black in your field of vision.  Have cold, clammy skin. DIAGNOSIS  Your health care provider will ask about your symptoms, perform a physical exam, and perform an electrocardiogram (ECG) to record the electrical activity of your heart. Your health care provider may also perform other heart or blood tests to determine the cause of your syncope which may include:  Transthoracic echocardiogram (TTE). During echocardiography, sound waves are used to evaluate how blood flows  through your heart.  Transesophageal echocardiogram (TEE).  Cardiac monitoring. This allows your health care provider to monitor your heart rate and rhythm in real time.  Holter monitor. This is a portable device that records your heartbeat and can help diagnose heart arrhythmias. It allows your health care provider to track your heart activity for several days, if needed.  Stress tests by exercise or by giving medicine that makes  the heart beat faster. TREATMENT  In most cases, no treatment is needed. Depending on the cause of your syncope, your health care provider may recommend changing or stopping some of your medicines. HOME CARE INSTRUCTIONS  Have someone stay with you until you feel stable.  Do not drive, use machinery, or play sports until your health care provider says it is okay.  Keep all follow-up appointments as directed by your health care provider.  Lie down right away if you start feeling like you might faint. Breathe deeply and steadily. Wait until all the symptoms have passed.  Drink enough fluids to keep your urine clear or pale yellow.  If you are taking blood pressure or heart medicine, get up slowly and take several minutes to sit and then stand. This can reduce dizziness. SEEK IMMEDIATE MEDICAL CARE IF:   You have a severe headache.  You have unusual pain in the chest, abdomen, or back.  You are bleeding from your mouth or rectum, or you have black or tarry stool.  You have an irregular or very fast heartbeat.  You have pain with breathing.  You have repeated fainting or seizure-like jerking during an episode.  You faint when sitting or lying down.  You have confusion.  You have trouble walking.  You have severe weakness.  You have vision problems. If you fainted, call your local emergency services (911 in [place] Do not drive yourself to the hospital.    This information is not intended to replace advice given to you by your health care provider. Make sure you discuss any questions you have with your health care provider.   Document Released: 02/24/2005 Document Revised: 07/11/2014 Document Reviewed: 04/25/2011 Elsevier Interactive Patient Education [employer]2016 Elsevier Inc.

## 2015-01-08 ENCOUNTER — Emergency Department (HOSPITAL_COMMUNITY): Payer: Medicaid Other

## 2015-01-08 ENCOUNTER — Encounter (HOSPITAL_COMMUNITY): Payer: Self-pay | Admitting: Emergency Medicine

## 2015-01-08 ENCOUNTER — Emergency Department (HOSPITAL_COMMUNITY)
Admission: EM | Admit: 2015-01-08 | Discharge: 2015-01-10 | Disposition: A | Discharge status: Home / Self Care | Payer: Medicaid Other | Attending: Emergency Medicine | Admitting: Emergency Medicine

## 2015-01-08 DIAGNOSIS — F6089 Other specific personality disorders: Secondary | ICD-10-CM | POA: Diagnosis not present

## 2015-01-08 DIAGNOSIS — F3481 Disruptive mood dysregulation disorder: Secondary | ICD-10-CM | POA: Diagnosis not present

## 2015-01-08 DIAGNOSIS — Z79899 Other long term (current) drug therapy: Secondary | ICD-10-CM | POA: Insufficient documentation

## 2015-01-08 DIAGNOSIS — Y9389 Activity, other specified: Secondary | ICD-10-CM | POA: Insufficient documentation

## 2015-01-08 DIAGNOSIS — F6381 Intermittent explosive disorder: Secondary | ICD-10-CM | POA: Diagnosis present

## 2015-01-08 DIAGNOSIS — Y9289 Other specified places as the place of occurrence of the external cause: Secondary | ICD-10-CM | POA: Diagnosis not present

## 2015-01-08 DIAGNOSIS — I10 Essential (primary) hypertension: Secondary | ICD-10-CM | POA: Insufficient documentation

## 2015-01-08 DIAGNOSIS — Z8669 Personal history of other diseases of the nervous system and sense organs: Secondary | ICD-10-CM | POA: Insufficient documentation

## 2015-01-08 DIAGNOSIS — R4182 Altered mental status, unspecified: Secondary | ICD-10-CM | POA: Insufficient documentation

## 2015-01-08 DIAGNOSIS — Y998 Other external cause status: Secondary | ICD-10-CM | POA: Diagnosis not present

## 2015-01-08 DIAGNOSIS — S20212A Contusion of left front wall of thorax, initial encounter: Secondary | ICD-10-CM | POA: Diagnosis not present

## 2015-01-08 DIAGNOSIS — Z91040 Latex allergy status: Secondary | ICD-10-CM | POA: Diagnosis not present

## 2015-01-08 DIAGNOSIS — S0083XA Contusion of other part of head, initial encounter: Secondary | ICD-10-CM | POA: Diagnosis not present

## 2015-01-08 DIAGNOSIS — F911 Conduct disorder, childhood-onset type: Secondary | ICD-10-CM | POA: Diagnosis present

## 2015-01-08 DIAGNOSIS — R4689 Other symptoms and signs involving appearance and behavior: Secondary | ICD-10-CM | POA: Diagnosis present

## 2015-01-08 LAB — CBC WITH DIFFERENTIAL/PLATELET
BASOS ABS: 0 10*3/uL (ref 0.0–0.1)
Basophils Relative: 0 %
Eosinophils Absolute: 0.1 10*3/uL (ref 0.0–0.7)
Eosinophils Relative: 1 %
HEMATOCRIT: 40.8 % (ref 39.0–52.0)
Hemoglobin: 13.2 g/dL (ref 13.0–17.0)
LYMPHS ABS: 1.7 10*3/uL (ref 0.7–4.0)
LYMPHS PCT: 24 %
MCH: 27.6 pg (ref 26.0–34.0)
MCHC: 32.4 g/dL (ref 30.0–36.0)
MCV: 85.2 fL (ref 78.0–100.0)
MONO ABS: 1.3 10*3/uL — AB (ref 0.1–1.0)
Monocytes Relative: 18 %
NEUTROS ABS: 4.1 10*3/uL (ref 1.7–7.7)
Neutrophils Relative %: 57 %
Platelets: 196 10*3/uL (ref 150–400)
RBC: 4.79 MIL/uL (ref 4.22–5.81)
RDW: 13.6 % (ref 11.5–15.5)
WBC: 7.2 10*3/uL (ref 4.0–10.5)

## 2015-01-08 LAB — COMPREHENSIVE METABOLIC PANEL
ALT: 36 U/L (ref 17–63)
AST: 44 U/L — AB (ref 15–41)
Albumin: 4.1 g/dL (ref 3.5–5.0)
Alkaline Phosphatase: 73 U/L (ref 38–126)
Anion gap: 6 (ref 5–15)
BILIRUBIN TOTAL: 0.5 mg/dL (ref 0.3–1.2)
BUN: 17 mg/dL (ref 6–20)
CO2: 28 mmol/L (ref 22–32)
CREATININE: 1.06 mg/dL (ref 0.61–1.24)
Calcium: 9.5 mg/dL (ref 8.9–10.3)
Chloride: 110 mmol/L (ref 101–111)
Glucose, Bld: 90 mg/dL (ref 65–99)
Potassium: 3.9 mmol/L (ref 3.5–5.1)
Sodium: 144 mmol/L (ref 135–145)
TOTAL PROTEIN: 7.8 g/dL (ref 6.5–8.1)

## 2015-01-08 LAB — ETHANOL

## 2015-01-08 LAB — VALPROIC ACID LEVEL: Valproic Acid Lvl: 76 ug/mL (ref 50.0–100.0)

## 2015-01-08 MED ORDER — AMANTADINE HCL 100 MG PO CAPS
100.0000 mg | ORAL_CAPSULE | Freq: Two times a day (BID) | ORAL | Status: DC
  Administered 2015-01-08 – 2015-01-10 (×4): 100 mg via ORAL
  Filled 2015-01-08 (×5): qty 1

## 2015-01-08 MED ORDER — DIVALPROEX SODIUM ER 500 MG PO TB24
500.0000 mg | ORAL_TABLET | Freq: Every day | ORAL | Status: DC
  Administered 2015-01-09 – 2015-01-10 (×2): 500 mg via ORAL
  Filled 2015-01-08 (×2): qty 1

## 2015-01-08 MED ORDER — OLANZAPINE 5 MG PO TABS
15.0000 mg | ORAL_TABLET | Freq: Every day | ORAL | Status: DC
  Administered 2015-01-08 – 2015-01-09 (×2): 15 mg via ORAL
  Filled 2015-01-08 (×4): qty 1

## 2015-01-08 MED ORDER — DOCUSATE SODIUM 100 MG PO CAPS
100.0000 mg | ORAL_CAPSULE | Freq: Three times a day (TID) | ORAL | Status: DC
Start: 2015-01-08 — End: 2015-01-10
  Administered 2015-01-08 – 2015-01-10 (×5): 100 mg via ORAL
  Filled 2015-01-08 (×4): qty 1

## 2015-01-08 MED ORDER — CLONAZEPAM 0.5 MG PO TABS
1.0000 mg | ORAL_TABLET | Freq: Two times a day (BID) | ORAL | Status: DC | PRN
Start: 2015-01-08 — End: 2015-01-10

## 2015-01-08 MED ORDER — DIVALPROEX SODIUM ER 500 MG PO TB24
750.0000 mg | ORAL_TABLET | Freq: Every day | ORAL | Status: DC
  Administered 2015-01-08 – 2015-01-09 (×2): 750 mg via ORAL
  Filled 2015-01-08 (×3): qty 1

## 2015-01-08 MED ORDER — SODIUM CHLORIDE 0.9 % IV BOLUS (SEPSIS)
1000.0000 mL | Freq: Once | INTRAVENOUS | Status: AC
  Administered 2015-01-08: 1000 mL via INTRAVENOUS

## 2015-01-08 MED ORDER — LAMOTRIGINE 100 MG PO TABS
100.0000 mg | ORAL_TABLET | Freq: Every morning | ORAL | Status: DC
  Administered 2015-01-09 – 2015-01-10 (×2): 100 mg via ORAL
  Filled 2015-01-08 (×2): qty 1

## 2015-01-08 MED ORDER — ACETAMINOPHEN 325 MG PO TABS
650.0000 mg | ORAL_TABLET | Freq: Once | ORAL | Status: AC
  Administered 2015-01-08: 650 mg via ORAL
  Filled 2015-01-08 (×2): qty 2

## 2015-01-08 MED ORDER — FLUOXETINE HCL 20 MG PO CAPS
40.0000 mg | ORAL_CAPSULE | Freq: Every day | ORAL | Status: DC
  Administered 2015-01-09 – 2015-01-10 (×2): 40 mg via ORAL
  Filled 2015-01-08 (×2): qty 2

## 2015-01-08 MED ORDER — PROPRANOLOL HCL 10 MG PO TABS
10.0000 mg | ORAL_TABLET | Freq: Two times a day (BID) | ORAL | Status: DC
  Administered 2015-01-09 – 2015-01-10 (×3): 10 mg via ORAL
  Filled 2015-01-08 (×5): qty 1

## 2015-01-08 MED ORDER — ACETAMINOPHEN 325 MG PO TABS: 650.0000 mg | ORAL_TABLET | Freq: Four times a day (QID) | ORAL | Status: DC | PRN
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
## 2015-01-08 NOTE — ED Notes (Signed)
Nurse currently starting IV 

## 2015-01-08 NOTE — ED Notes (Signed)
[facility] Expected date:  Expected time:  Means of arrival:  Comments: Triage psych
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
## 2015-01-08 NOTE — ED Notes (Signed)
MD at bedside. 

## 2015-01-08 NOTE — ED Notes (Signed)
Placed patient valuables into locker 35, just clothes.

## 2015-01-09 DIAGNOSIS — F6381 Intermittent explosive disorder: Secondary | ICD-10-CM

## 2015-01-09 DIAGNOSIS — F3481 Disruptive mood dysregulation disorder: Secondary | ICD-10-CM | POA: Diagnosis not present

## 2015-01-09 DIAGNOSIS — F6089 Other specific personality disorders: Secondary | ICD-10-CM | POA: Diagnosis not present

## 2015-01-09 NOTE — Progress Notes (Signed)
CSW reached out to Director of patient's group home Chrissie NoaWilliam at [phone]). CSW prepared director that patient may be returning back to the group home tomorrow. Director expressed understanding. He states that he does not have any questions at this time.   Trish MageBrittney Marc Sivertsen, LCSWA [phone] ED CSW 01/09/2015 3:07 PM
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
## 2015-01-10 DIAGNOSIS — F3481 Disruptive mood dysregulation disorder: Secondary | ICD-10-CM | POA: Diagnosis not present

## 2015-01-10 MED ORDER — DIPHENHYDRAMINE HCL 50 MG/ML IJ SOLN: 50.0000 mg | Freq: Once | INTRAMUSCULAR | Status: DC

## 2015-01-10 MED ORDER — LORAZEPAM 2 MG/ML IJ SOLN: 2.0000 mg | Freq: Once | INTRAMUSCULAR | Status: DC

## 2015-01-10 MED ORDER — ZIPRASIDONE MESYLATE 20 MG IM SOLR: 20.0000 mg | Freq: Once | INTRAMUSCULAR | Status: DC

## 2015-01-10 NOTE — Progress Notes (Signed)
CSW reached out to patient's mom and informed her that the patient will be discharged back to his group home today.  Mom is the guardian for patient.  Dawn Brown/ [phone]  Trish MageBrittney Tamicka Shimon, LCSWA [phone] ED CSW 01/10/2015 12:52 PM

## 2015-01-10 NOTE — Progress Notes (Signed)
Dean Shepherd who is the Director of [facility] reached out to CSW. He states that he [facility] around 4:30 - 4:45. CSW made NP aware. CSW made nurse aware.  Dean MageBrittney Jiovanna Shepherd, LCSWA [phone] ED CSW 01/10/2015 3:08 PM

## 2015-01-10 NOTE — [facility] Suicide Risk Assessment (Signed)
Suicide Risk Assessment  Discharge Assessment   [facility]   Demographic Factors:  Adolescent or young adult and Caucasian  Total Time spent with patient: 15 minutes  Musculoskeletal: Strength & Muscle Tone: within normal limits Gait & Station: normal Patient leans: N/A  Psychiatric Specialty Exam:     Blood pressure 127/63, pulse 68, temperature 97.5 F (36.4 C), temperature source Oral, resp. rate 20, SpO2 98 %.There is no weight on file to calculate BMI.   General Appearance: Casual  Eye Contact::  Good  Speech:  Normal Rate  Volume:  Normal  Mood:  Irritable  Affect:  Congruent  Thought Process:  Coherent  Orientation:  Full (Time, Place, and Person)  Thought Content:  Rumination  Suicidal Thoughts:  No  Homicidal Thoughts:  No  Memory:  Immediate;   Fair Recent;   Fair Remote;   Fair  Judgement:  Fair  Insight:  Fair  Psychomotor Activity:  Decreased  Concentration:  Fair  Recall:  [employer]  Fund of Knowledge:Fair  Language: Fair  Akathisia:  No  Handed:  Right  AIMS (if indicated):     Assets:  Housing Leisure Time Physical Health Resilience  ADL's:  Intact  Cognition: Impaired,  Mild  Sleep:          Has this patient used any form of tobacco in the last 30 days? (Cigarettes, Smokeless Tobacco, Cigars, and/or Pipes) No  Mental Status Per Nursing Assessment::   On Admission:  Disruptive mood  Current Mental Status by Physician: NA  Loss Factors: NA  Historical Factors: Impulsivity  Risk Reduction Factors:   Living with another person, especially a relative and Positive social support  Continued Clinical Symptoms:  More than one psychiatric diagnosis  Cognitive Features That Contribute To Risk:  Loss of executive function    Suicide Risk:  Minimal: No identifiable suicidal ideation.  Patients presenting with no risk factors but with morbid ruminations; may be classified as minimal risk based on the severity of  the depressive symptoms  Principal Problem: Disruptive mood dysregulation disorder [facility](HCC) Discharge Diagnoses:  Patient Active Problem List   Diagnosis Date Noted  . Disruptive mood dysregulation disorder ([facility]) [F34.81] 06/29/2014  . Intermittent explosive disorder [F63.81] 06/29/2014  . Mental retardation [F79]   . Oppositional defiant disorder [F91.3]   . Aggressive behavior [F60.89] 07/05/2013  . Physically aggressive behavior [F60.3] 07/05/2013      Plan Of Care/Follow-up recommendations:  Activity:  As tolerated Diet:  Regular Tests:  As to be determined by PCP Other:  Follow with outpatient providers as needed  Is patient on multiple antipsychotic therapies at discharge:  No   Has Patient had three or more failed trials of antipsychotic monotherapy by history:  No  Recommended Plan for Multiple Antipsychotic Therapies: NA    Alberteen SamFran Brek Reece, FNP-BC [facility] 01/10/2015, 3:10 PM
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
## 2015-01-10 NOTE — Progress Notes (Signed)
01/10/15  1635  Patient was cooperative and pleasant at discharge. Patient was happy to see Will, gave him a hand shake and told him he was sorry for his behavior. They walked out together and Joselyn Glassmanyler had his arm around Will.

## 2015-01-10 NOTE — Progress Notes (Signed)
CSW reached out to Chrissie NoaWilliam who is Catering managerthe director of the patient's Group Home. CSW informed him that the patient is up for discharge.   Chrissie NoaWilliam expressed understanding. Chrissie NoaWilliam states that he will call supervisor and that he will call CSW back within an hour to give an exact time that [facility]. Chrissie NoaWilliam expressed no concerns and had no questions for CSW.  Trish MageBrittney Kalyna Paolella, LCSWA [phone] ED CSW 01/10/2015 12:08 PM

## 2015-01-19 ENCOUNTER — Encounter (HOSPITAL_COMMUNITY): Payer: Self-pay | Admitting: Emergency Medicine

## 2015-01-19 ENCOUNTER — Emergency Department (HOSPITAL_COMMUNITY)
Admission: EM | Admit: 2015-01-19 | Discharge: 2015-01-22 | Disposition: A | Discharge status: Home / Self Care | Payer: Medicaid Other | Attending: Emergency Medicine | Admitting: Emergency Medicine

## 2015-01-19 DIAGNOSIS — Z8669 Personal history of other diseases of the nervous system and sense organs: Secondary | ICD-10-CM | POA: Insufficient documentation

## 2015-01-19 DIAGNOSIS — Z79899 Other long term (current) drug therapy: Secondary | ICD-10-CM | POA: Insufficient documentation

## 2015-01-19 DIAGNOSIS — F909 Attention-deficit hyperactivity disorder, unspecified type: Secondary | ICD-10-CM | POA: Diagnosis not present

## 2015-01-19 DIAGNOSIS — Z91040 Latex allergy status: Secondary | ICD-10-CM | POA: Diagnosis not present

## 2015-01-19 DIAGNOSIS — F3481 Disruptive mood dysregulation disorder: Secondary | ICD-10-CM

## 2015-01-19 DIAGNOSIS — R4689 Other symptoms and signs involving appearance and behavior: Secondary | ICD-10-CM

## 2015-01-19 DIAGNOSIS — R451 Restlessness and agitation: Secondary | ICD-10-CM | POA: Insufficient documentation

## 2015-01-19 DIAGNOSIS — F913 Oppositional defiant disorder: Secondary | ICD-10-CM | POA: Diagnosis not present

## 2015-01-19 DIAGNOSIS — I10 Essential (primary) hypertension: Secondary | ICD-10-CM | POA: Insufficient documentation

## 2015-01-19 DIAGNOSIS — Z046 Encounter for general psychiatric examination, requested by authority: Secondary | ICD-10-CM | POA: Diagnosis present

## 2015-01-19 DIAGNOSIS — F6089 Other specific personality disorders: Secondary | ICD-10-CM | POA: Diagnosis not present

## 2015-01-19 DIAGNOSIS — F911 Conduct disorder, childhood-onset type: Secondary | ICD-10-CM | POA: Insufficient documentation

## 2015-01-19 DIAGNOSIS — R45850 Homicidal ideations: Secondary | ICD-10-CM

## 2015-01-19 LAB — BASIC METABOLIC PANEL
Anion gap: 9 (ref 5–15)
BUN: 14 mg/dL (ref 6–20)
CO2: 28 mmol/L (ref 22–32)
Calcium: 9.2 mg/dL (ref 8.9–10.3)
Chloride: 104 mmol/L (ref 101–111)
Creatinine, Ser: 1.1 mg/dL (ref 0.61–1.24)
GFR calc Af Amer: >60 mL/min (ref >60)
GFR calc non Af Amer: >60 mL/min (ref >60)
Glucose, Bld: 193 mg/dL — ABNORMAL HIGH (ref 65–99)
Potassium: 3.8 mmol/L (ref 3.5–5.1)
Sodium: 141 mmol/L (ref 135–145)

## 2015-01-19 LAB — CBC WITH DIFFERENTIAL/PLATELET
Basophils Absolute: 0 10*3/uL (ref 0.0–0.1)
Basophils Relative: 0 %
Eosinophils Absolute: 0.1 10*3/uL (ref 0.0–0.7)
Eosinophils Relative: 2 %
HCT: 41.1 % (ref 39.0–52.0)
Hemoglobin: 13.2 g/dL (ref 13.0–17.0)
Lymphocytes Relative: 36 %
Lymphs Abs: 2.4 10*3/uL (ref 0.7–4.0)
MCH: 27.5 pg (ref 26.0–34.0)
MCHC: 32.1 g/dL (ref 30.0–36.0)
MCV: 85.6 fL (ref 78.0–100.0)
Monocytes Absolute: 1 10*3/uL (ref 0.1–1.0)
Monocytes Relative: 15 %
Neutro Abs: 3.1 10*3/uL (ref 1.7–7.7)
Neutrophils Relative %: 47 %
Platelets: 223 10*3/uL (ref 150–400)
RBC: 4.8 MIL/uL (ref 4.22–5.81)
RDW: 13.5 % (ref 11.5–15.5)
WBC: 6.6 10*3/uL (ref 4.0–10.5)

## 2015-01-19 LAB — ETHANOL: Alcohol, Ethyl (B): <5 mg/dL (ref <5)

## 2015-01-19 MED ORDER — LAMOTRIGINE 100 MG PO TABS
100.0000 mg | ORAL_TABLET | Freq: Every morning | ORAL | Status: DC
  Administered 2015-01-20 – 2015-01-22 (×3): 100 mg via ORAL
  Filled 2015-01-19 (×3): qty 1

## 2015-01-19 MED ORDER — ONDANSETRON HCL 4 MG PO TABS: 4.0000 mg | ORAL_TABLET | Freq: Three times a day (TID) | ORAL | Status: DC | PRN

## 2015-01-19 MED ORDER — ALUM & MAG HYDROXIDE-SIMETH 200-200-20 MG/5ML PO SUSP: 30.0000 mL | ORAL | Status: DC | PRN

## 2015-01-19 MED ORDER — IBUPROFEN 200 MG PO TABS: 600.0000 mg | ORAL_TABLET | Freq: Three times a day (TID) | ORAL | Status: DC | PRN

## 2015-01-19 MED ORDER — AMANTADINE HCL 100 MG PO CAPS
100.0000 mg | ORAL_CAPSULE | Freq: Two times a day (BID) | ORAL | Status: DC
  Administered 2015-01-19 – 2015-01-22 (×6): 100 mg via ORAL
  Filled 2015-01-19 (×7): qty 1

## 2015-01-19 MED ORDER — OLANZAPINE 5 MG PO TABS
15.0000 mg | ORAL_TABLET | Freq: Every day | ORAL | Status: DC
  Administered 2015-01-19 – 2015-01-21 (×3): 15 mg via ORAL
  Filled 2015-01-19 (×6): qty 1

## 2015-01-19 MED ORDER — DIVALPROEX SODIUM ER 500 MG PO TB24
750.0000 mg | ORAL_TABLET | Freq: Every day | ORAL | Status: DC
  Administered 2015-01-19 – 2015-01-21 (×3): 750 mg via ORAL
  Filled 2015-01-19 (×5): qty 1

## 2015-01-19 MED ORDER — DIVALPROEX SODIUM ER 500 MG PO TB24
500.0000 mg | ORAL_TABLET | Freq: Every day | ORAL | Status: DC
  Administered 2015-01-20 – 2015-01-22 (×3): 500 mg via ORAL
  Filled 2015-01-19 (×3): qty 1

## 2015-01-19 MED ORDER — ACETAMINOPHEN 325 MG PO TABS: 650.0000 mg | ORAL_TABLET | ORAL | Status: DC | PRN

## 2015-01-19 MED ORDER — PROPRANOLOL HCL 10 MG PO TABS
10.0000 mg | ORAL_TABLET | Freq: Two times a day (BID) | ORAL | Status: DC
  Administered 2015-01-19 – 2015-01-22 (×6): 10 mg via ORAL
  Filled 2015-01-19 (×7): qty 1

## 2015-01-19 MED ORDER — FLUOXETINE HCL 20 MG PO CAPS
40.0000 mg | ORAL_CAPSULE | Freq: Every day | ORAL | Status: DC
  Administered 2015-01-19 – 2015-01-22 (×4): 40 mg via ORAL
  Filled 2015-01-19 (×4): qty 2

## 2015-01-19 MED ORDER — DOCUSATE SODIUM 100 MG PO CAPS
100.0000 mg | ORAL_CAPSULE | Freq: Three times a day (TID) | ORAL | Status: DC
  Administered 2015-01-19 – 2015-01-22 (×9): 100 mg via ORAL
  Filled 2015-01-19 (×10): qty 1

## 2015-01-19 MED ORDER — CLONAZEPAM 0.5 MG PO TABS
1.0000 mg | ORAL_TABLET | Freq: Two times a day (BID) | ORAL | Status: DC | PRN
  Administered 2015-01-22: 1 mg via ORAL
  Filled 2015-01-19: qty 2
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
## 2015-01-19 NOTE — Progress Notes (Signed)
Mom reached out to CSW and states that she does not want the patient [facility].  Trish MageBrittney Greta Yung, LCSWA [phone] ED CSW 01/19/2015 7:50 PM
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
## 2015-01-20 DIAGNOSIS — F913 Oppositional defiant disorder: Secondary | ICD-10-CM | POA: Diagnosis not present

## 2015-01-20 DIAGNOSIS — F6089 Other specific personality disorders: Secondary | ICD-10-CM

## 2015-01-20 LAB — RAPID URINE DRUG SCREEN, HOSP PERFORMED
Amphetamines: NOT DETECTED
BARBITURATES: NOT DETECTED
BENZODIAZEPINES: NOT DETECTED
COCAINE: NOT DETECTED
Opiates: NOT DETECTED
TETRAHYDROCANNABINOL: NOT DETECTED

## 2015-01-20 NOTE — B[facility] Counselor (Signed)
Referral sent to [place], [facility], [facility], [place], [address] [place], [place], [facility], [address], [address], [place], [facility], [place], and [place]  Carolee Channell K. Sherlon HandingHarris, LCAS-A, LPC-A, [facility]  Counselor 01/20/2015 12:17 AM

## 2015-01-20 NOTE — Progress Notes (Signed)
4:43pm. [place] [facility] called CSW to inform her that they are coming out to do assessment. Will be here after 5pm. CSW left assessment information on her desk for [place] [facility] worker. Please advise  START worker to leave her assessment in pt's folder.  York SpanielAlexandra Blimie Vaness [facility]CSWA Clinical Social Worker [facility] phone: [phone]

## 2015-01-20 NOTE — Progress Notes (Addendum)
Pt remains with a sitter and is good spirits this am. He contracts for safety. Pt asked the nurse if he could have,"chicken tenders with barb que sauce and french fries for lunch if good." pt is presently watching TV . Urine was obtained and sent to the lab. 11am-Pt took a shower. 1p-Pt was very excited to receive chicken tenders for lunch. He remains very calm and cooperative watching TV. Pt told the tech and the nurse that he never wants to go to jail again. He stated,"I was there for 5 nights and it was really bad." 3p-Pt is watching TV, Sitter remains near bedside. 3:15p_pt inquired how long he would remain here. [name],social worker met with the pt at 3:40pm. 6p-[place] start came to see the pt.
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
## 2015-01-20 NOTE — Progress Notes (Signed)
11:57am. Dr. Shela CommonsJ upholds pt IVC. CSW faxed and filed paperwork.   York SpanielAlexandra Soren Pigman [facility]ellasCSWA Clinical Social Worker [facility] phone: [phone]
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
## 2015-01-21 NOTE — Progress Notes (Signed)
Patient was reassessed by CSW.  Pt states that he is doing well today but that he does not want to go back to his group home. Pt states "I heard a little birdy said I didn't have to go back." CSW explained that she had spoken with Chrissie NoaWilliam, group home manager, and that group home was willing to accept him back when he is cleared. CSW also informed pt that eventually he would have to find a new place to live, as group home has served a 60 day notice of eviction to pt on Monday. Pt states he understands. Pt denies HI and AVH. However, pt expressed passive ideation when confronted with possibility of going back.   CSW spoke with group home manager Demetrius CharityWilliam. William expresses that they will receive pt back, though he advocated for pt to get inpatient psychiatric tx since he has been admitted now 3 times since October 2016 for psych/behavioral concenrs.   Psych recommends inpatient tx/medication stabilization at this time.  York SpanielAlexandra Elonda Giuliano [facility] Clinical Social Worker [facility] phone: [phone]

## 2015-01-22 NOTE — Progress Notes (Signed)
CSW spoke with [facility].  CSW made NP aware. CSW made nurse aware.  Trish MageBrittney Custer Pimenta, LCSWA [phone] ED CSW 01/22/2015 3:29 PM

## 2015-01-22 NOTE — [facility] Suicide Risk Assessment (Signed)
Suicide Risk Assessment  Discharge Assessment   [facility]   Demographic Factors:  Male, Adolescent or young adult and Caucasian  Total Time spent with patient: 30 minutes  Musculoskeletal: Strength & Muscle Tone: within normal limits Gait & Station: normal Patient leans: N/A  Psychiatric Specialty Exam:     Blood pressure 136/63, pulse 65, temperature 98.3 F (36.8 C), temperature source Oral, resp. rate 18, SpO2 100 %.There is no weight on file to calculate BMI.  General Appearance: Casual  Eye Contact::  Good  Speech:  Normal Rate409  Volume:  Normal  Mood:  Euthymic  Affect:  Congruent  Thought Process:  Coherent  Orientation:  Full (Time, Place, and Person)  Thought Content:  WDL  Suicidal Thoughts:  No  Homicidal Thoughts:  No  Memory:  Immediate;   Good Recent;   Good Remote;   Good  Judgement:  Fair  Insight:  Fair  Psychomotor Activity:  Normal  Concentration:  Good  Recall:  Good  Fund of Knowledge:Fair  Language: Fair  Akathisia:  No  Handed:  Right  AIMS (if indicated):     Assets:  Housing Leisure Time Physical Health Resilience Social Support  Sleep:     Cognition: Impaired,  Moderate  ADL's:  Intact      Has this patient used any form of tobacco in the last 30 days? (Cigarettes, Smokeless Tobacco, Cigars, and/or Pipes) No  Mental Status Per Nursing Assessment::   On Admission:   Agitation, aggression  Current Mental Status by Physician: NA  Loss Factors: NA  Historical Factors: Impulsivity  Risk Reduction Factors:   Living with another person, especially a relative, Positive social support and Positive therapeutic relationship  Continued Clinical Symptoms:  None   Cognitive Features That Contribute To Risk:  None    Suicide Risk:  Minimal: No identifiable suicidal ideation.  Patients presenting with no risk factors but with morbid ruminations; may be classified as minimal risk based on the severity  of the depressive symptoms  Principal Problem: Oppositional defiant disorder Discharge Diagnoses:  Patient Active Problem List   Diagnosis Date Noted  . Disruptive mood dysregulation disorder (HCC) [F34.81] 06/29/2014    Priority: High  . Intermittent explosive disorder [F63.81] 06/29/2014    Priority: High  . Aggressive behavior [F60.89] 07/05/2013    Priority: High  . Mental retardation [F79]   . Oppositional defiant disorder [F91.3]   . Physically aggressive behavior [F60.3] 07/05/2013      Plan Of Care/Follow-up recommendations:  Activity:  as tolerated Diet:  heart healthy diet  Is patient on multiple antipsychotic therapies at discharge:  No   Has Patient had three or more failed trials of antipsychotic monotherapy by history:  No  Recommended Plan for Multiple Antipsychotic Therapies: NA    Dean Shepherd, PMH-NP 01/22/2015, 1:08 PM
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
## 2015-01-22 NOTE — Progress Notes (Signed)
CSW reached out to [place] at [phone] who is the patient's Group Home director. CSW informed him that the patient is up for discharged and ready to be picked up.   Group Home owner states that he has to work on making arrangements to help him pick up patient. He was unable to give the patient an exact time of when he would be here this afternoon.  CSW will continue to follow up.  Trish MageBrittney Laquenta Whitsell, LCSWA [phone] ED CSW 01/22/2015 12:32 PM

## 2015-01-23 ENCOUNTER — Encounter (HOSPITAL_COMMUNITY): Payer: Self-pay

## 2015-01-23 ENCOUNTER — Emergency Department (HOSPITAL_COMMUNITY)
Admission: EM | Admit: 2015-01-23 | Discharge: 2015-02-02 | Disposition: A | Discharge status: Home / Self Care | Payer: Medicaid Other | Attending: Emergency Medicine | Admitting: Emergency Medicine

## 2015-01-23 DIAGNOSIS — F909 Attention-deficit hyperactivity disorder, unspecified type: Secondary | ICD-10-CM | POA: Diagnosis not present

## 2015-01-23 DIAGNOSIS — I10 Essential (primary) hypertension: Secondary | ICD-10-CM | POA: Diagnosis not present

## 2015-01-23 DIAGNOSIS — Z91040 Latex allergy status: Secondary | ICD-10-CM | POA: Diagnosis not present

## 2015-01-23 DIAGNOSIS — R45851 Suicidal ideations: Secondary | ICD-10-CM | POA: Diagnosis present

## 2015-01-23 DIAGNOSIS — F913 Oppositional defiant disorder: Secondary | ICD-10-CM | POA: Diagnosis not present

## 2015-01-23 DIAGNOSIS — Z8669 Personal history of other diseases of the nervous system and sense organs: Secondary | ICD-10-CM | POA: Diagnosis not present

## 2015-01-23 DIAGNOSIS — Z79899 Other long term (current) drug therapy: Secondary | ICD-10-CM | POA: Insufficient documentation

## 2015-01-23 LAB — RAPID URINE DRUG SCREEN, HOSP PERFORMED
AMPHETAMINES: NOT DETECTED
BENZODIAZEPINES: NOT DETECTED
Barbiturates: NOT DETECTED
COCAINE: NOT DETECTED
OPIATES: NOT DETECTED
TETRAHYDROCANNABINOL: NOT DETECTED

## 2015-01-23 LAB — CBC
HEMATOCRIT: 42.4 % (ref 39.0–52.0)
HEMOGLOBIN: 13.9 g/dL (ref 13.0–17.0)
MCH: 27.7 pg (ref 26.0–34.0)
MCHC: 32.8 g/dL (ref 30.0–36.0)
MCV: 84.5 fL (ref 78.0–100.0)
Platelets: 229 10*3/uL (ref 150–400)
RBC: 5.02 MIL/uL (ref 4.22–5.81)
RDW: 13.5 % (ref 11.5–15.5)
WBC: 5.4 10*3/uL (ref 4.0–10.5)

## 2015-01-23 LAB — COMPREHENSIVE METABOLIC PANEL
ALBUMIN: 4.2 g/dL (ref 3.5–5.0)
ALT: 32 U/L (ref 17–63)
ANION GAP: 8 (ref 5–15)
AST: 25 U/L (ref 15–41)
Alkaline Phosphatase: 85 U/L (ref 38–126)
BUN: 9 mg/dL (ref 6–20)
CALCIUM: 9.6 mg/dL (ref 8.9–10.3)
CO2: 27 mmol/L (ref 22–32)
Chloride: 107 mmol/L (ref 101–111)
Creatinine, Ser: 0.83 mg/dL (ref 0.61–1.24)
GFR calc non Af Amer: >60 mL/min (ref >60)
GLUCOSE: 101 mg/dL — AB (ref 65–99)
POTASSIUM: 4.1 mmol/L (ref 3.5–5.1)
SODIUM: 142 mmol/L (ref 135–145)
Total Bilirubin: 0.4 mg/dL (ref 0.3–1.2)
Total Protein: 7.4 g/dL (ref 6.5–8.1)

## 2015-01-23 LAB — SALICYLATE LEVEL

## 2015-01-23 LAB — ETHANOL: Alcohol, Ethyl (B): <5 mg/dL (ref <5)

## 2015-01-23 LAB — ACETAMINOPHEN LEVEL

## 2015-01-23 LAB — VALPROIC ACID LEVEL: VALPROIC ACID LVL: 92 ug/mL (ref 50.0–100.0)

## 2015-01-23 MED ORDER — LAMOTRIGINE 100 MG PO TABS
100.0000 mg | ORAL_TABLET | Freq: Every morning | ORAL | Status: DC
  Administered 2015-01-24 – 2015-02-02 (×10): 100 mg via ORAL
  Filled 2015-01-23 (×10): qty 1

## 2015-01-23 MED ORDER — DIVALPROEX SODIUM ER 500 MG PO TB24
750.0000 mg | ORAL_TABLET | Freq: Every day | ORAL | Status: DC
  Administered 2015-01-23 – 2015-02-01 (×10): 750 mg via ORAL
  Filled 2015-01-23 (×12): qty 1

## 2015-01-23 MED ORDER — AMANTADINE HCL 100 MG PO CAPS
100.0000 mg | ORAL_CAPSULE | Freq: Two times a day (BID) | ORAL | Status: DC
  Administered 2015-01-23 – 2015-02-02 (×20): 100 mg via ORAL
  Filled 2015-01-23 (×22): qty 1

## 2015-01-23 MED ORDER — DIVALPROEX SODIUM ER 500 MG PO TB24
500.0000 mg | ORAL_TABLET | Freq: Every day | ORAL | Status: DC
  Administered 2015-01-24 – 2015-02-02 (×10): 500 mg via ORAL
  Filled 2015-01-23 (×10): qty 1

## 2015-01-23 MED ORDER — CLONAZEPAM 0.5 MG PO TABS: 1.0000 mg | ORAL_TABLET | Freq: Two times a day (BID) | ORAL | Status: DC | PRN

## 2015-01-23 MED ORDER — DOCUSATE SODIUM 100 MG PO CAPS
100.0000 mg | ORAL_CAPSULE | Freq: Three times a day (TID) | ORAL | Status: DC
  Administered 2015-01-23 – 2015-02-02 (×31): 100 mg via ORAL
  Filled 2015-01-23 (×30): qty 1

## 2015-01-23 MED ORDER — FLUOXETINE HCL 20 MG PO CAPS
40.0000 mg | ORAL_CAPSULE | Freq: Every day | ORAL | Status: DC
  Administered 2015-01-24 – 2015-02-02 (×10): 40 mg via ORAL
  Filled 2015-01-23 (×10): qty 2

## 2015-01-23 MED ORDER — OLANZAPINE 10 MG PO TABS
15.0000 mg | ORAL_TABLET | Freq: Every day | ORAL | Status: DC
  Administered 2015-01-23: 15 mg via ORAL
  Filled 2015-01-23 (×2): qty 1

## 2015-01-23 MED ORDER — PROPRANOLOL HCL 10 MG PO TABS
10.0000 mg | ORAL_TABLET | Freq: Two times a day (BID) | ORAL | Status: DC
  Administered 2015-01-23 – 2015-01-28 (×10): 10 mg via ORAL
  Filled 2015-01-23 (×12): qty 1

## 2015-01-23 NOTE — ED Provider Notes (Signed)
CSN: [id number]     Arrival date & time 01/23/15  1326 History   First MD Initiated Contact with Patient 01/23/15 1507     Chief Complaint  Patient presents with  . Suicidal     (Consider location/radiation/quality/duration/timing/severity/associated sxs/prior Treatment) HPI   Dean Shepherd is a 19 y.o. male, with a history of MR, ODD, hydrocephalus, and HTN, presenting to the ED with suicidal ideations. Pt states, "I want to kill myself by cutting my throat, my wrists, or my leg." Pt states he has attempted suicide about a year ago by attempting to hang himself. Pt denies taking any drugs or alcohol. Pt lives in a group home. Pt has not been sleeping well lately. Pt states he wants to kill himself because he has a lot of stress and changes going on in his life that make him scared. Pt was brought in by [employer]. Pt sees a psychiatrist at [facility], but does not know his name. Pt confirms that he has been taking all of his prescribed medications and has not taken more of them than he is prescribed. Denies auditory or visual hallucinations.  Patient requests to go to the TCU because he likes it there better. Patient was discharged from behavioral health today after being admitted on November 11 for agitation and suicidal ideations.    Past Medical History  Diagnosis Date  . Hypertension   . Oppositional defiant behavior   . ADHD (attention deficit hyperactivity disorder)   . Hydrocephalus   . Mental retardation    Past Surgical History  Procedure Laterality Date  . Csf shunt    . Elbow surgery     Family History  Problem Relation Age of Onset  . Family history unknown: Yes   Social History  Substance Use Topics  . Smoking status: Never Smoker   . Smokeless tobacco: Never Used  . Alcohol Use: No    Review of Systems  Psychiatric/Behavioral: Positive for suicidal ideas.  All other systems reviewed and are negative.     Allergies  Latex  Home Medications   Prior to Admission  medications   Medication Sig Start Date End Date Taking? Authorizing Provider  acetaminophen (TYLENOL) 325 MG tablet Take 650 mg by mouth every 6 (six) hours as needed for moderate pain or headache.   Yes Historical Provider, MD  amantadine (SYMMETREL) 100 MG capsule Take 100 mg by mouth 2 (two) times daily.   Yes Historical Provider, MD  clonazePAM (KLONOPIN) 1 MG tablet Take 1 tablet (1 mg total) by mouth 2 (two) times daily as needed (agitation). 06/30/14  Yes Charm Rings, NP  divalproex (DEPAKOTE ER) 250 MG 24 hr tablet Take 750 mg by mouth at bedtime.   Yes Historical Provider, MD  divalproex (DEPAKOTE ER) 500 MG 24 hr tablet Take 500 mg by mouth daily.   Yes Historical Provider, MD  docusate sodium (COLACE) 100 MG capsule Take 100 mg by mouth 3 (three) times daily.   Yes Historical Provider, MD  FLUoxetine (PROZAC) 40 MG capsule Take 40 mg by mouth daily.   Yes Historical Provider, MD  lamoTRIgine (LAMICTAL) 100 MG tablet Take 100 mg by mouth every morning.    Yes Historical Provider, MD  OLANZapine (ZYPREXA) 15 MG tablet Take 15 mg by mouth at bedtime.   Yes Historical Provider, MD  propranolol (INDERAL) 10 MG tablet Take 10 mg by mouth 2 (two) times daily.   Yes Historical Provider, MD   BP 149/69 mmHg  Pulse 86  Temp(Src) 98.3 F (36.8 C) (Oral)  Resp 18  SpO2 97% Physical Exam  Constitutional: He is oriented to person, place, and time. He appears well-developed and well-nourished. No distress.  HENT:  Head: Normocephalic and atraumatic.  Eyes: Conjunctivae are normal. Pupils are equal, round, and reactive to light.  Neck: Normal range of motion. Neck supple.  Cardiovascular: Normal rate, regular rhythm and normal heart sounds.   Pulmonary/Chest: Effort normal and breath sounds normal. No respiratory distress.  Abdominal: Soft. Bowel sounds are normal.  Musculoskeletal: He exhibits no edema or tenderness.  Lymphadenopathy:    He has no cervical adenopathy.  Neurological: He  is alert and oriented to person, place, and time.  Skin: Skin is warm and dry. He is not diaphoretic.  Psychiatric: He has a normal mood and affect. His behavior is normal.  Nursing note and vitals reviewed.   ED Course  Procedures (including critical care time) Labs Review Labs Reviewed  COMPREHENSIVE METABOLIC PANEL - Abnormal; Notable for the following:    Glucose, Bld 101 (*)    All other components within normal limits  ACETAMINOPHEN LEVEL - Abnormal; Notable for the following:    Acetaminophen (Tylenol), Serum <10 (*)    All other components within normal limits  ETHANOL  SALICYLATE LEVEL  CBC  URINE RAPID DRUG SCREEN, HOSP PERFORMED  VALPROIC ACID LEVEL    Imaging Review No results found. I have personally reviewed and evaluated these images and lab results as part of my medical decision-making.   EKG Interpretation   Date/Time:  Tuesday January 23 2015 16:11:27 EST Ventricular Rate:  71 PR Interval:  100 QRS Duration: 102 QT Interval:  367 QTC Calculation: 399 R Axis:   50 Text Interpretation:  Sinus rhythm Short PR interval RSR' in V1 or V2,  probably normal variant Borderline Q waves in inferior leads Baseline  wander in lead(s) I II aVR No significant change since last tracing  Confirmed by Ethelda ChickJACUBOWITZ  MD, SAM [phone] on 01/23/2015 4:16:48 PM      MDM   Final diagnoses:  Suicidal ideations    Dean Loseryler Ghrist presents with suicidal ideations.  Findings and plan of care discussed with Doug SouSam Jacubowitz, MD.  Psych hold and medical clearance initiated, including a sitter at the bedside.  Patient has no other complaints, but has a definite plan. Patient to be evaluated by TTS. No significant abnormalities on the patient's lab work.    Anselm PancoastShawn C Joy, PA-C 01/23/15 1747  Doug SouSam Jacubowitz, MD 01/24/15 [phone]
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
## 2015-01-23 NOTE — ED Notes (Signed)
TTS telepsych computer at bedside for assessment.

## 2015-01-23 NOTE — ED Notes (Addendum)
Pt expressed he disliked the dinner tray. I let the pt know that he will not be getting any extra orders or sandwiches because he does not care for the food per NP.

## 2015-01-23 NOTE — ED Notes (Signed)
Pt noted to be smiling and joking around w/ [employer] officers and staff.
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
## 2015-01-23 NOTE — ED Notes (Signed)
Patient belongings: Red hoodie, black pants, camo shirt, socks, shoes

## 2015-01-24 DIAGNOSIS — F913 Oppositional defiant disorder: Secondary | ICD-10-CM | POA: Diagnosis not present

## 2015-01-24 DIAGNOSIS — R45851 Suicidal ideations: Secondary | ICD-10-CM | POA: Insufficient documentation

## 2015-01-24 MED ORDER — CLONAZEPAM 0.5 MG PO TABS
1.0000 mg | ORAL_TABLET | Freq: Two times a day (BID) | ORAL | Status: DC
  Administered 2015-01-24 – 2015-02-02 (×19): 1 mg via ORAL
  Filled 2015-01-24 (×21): qty 2

## 2015-01-24 MED ORDER — OLANZAPINE 5 MG PO TABS
15.0000 mg | ORAL_TABLET | Freq: Two times a day (BID) | ORAL | Status: DC
  Administered 2015-01-25 – 2015-02-02 (×18): 15 mg via ORAL
  Filled 2015-01-24 (×19): qty 1

## 2015-01-24 MED ORDER — ONDANSETRON 4 MG PO TBDP
4.0000 mg | ORAL_TABLET | Freq: Once | ORAL | Status: AC
  Administered 2015-01-24: 4 mg via ORAL
  Filled 2015-01-24: qty 1
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
## 2015-01-24 NOTE — ED Notes (Signed)
PT IS TAKING HIS 2ND CALL

## 2015-01-24 NOTE — Progress Notes (Addendum)
CS[facility] and informed him that the patient is up for discharge. He informed CSW that he would have to consult with his team before they can pick the patient up. CSW asked to speak with supervisor.  CSW spoke with Dean Shepherd/ Owner  At [phone] who states that he does not have staff in place. CSW informed him that, that the patient is still up for discharge and that [facility] cannot keep the patient until he makes arrangements.  Owner informed CSW to reach [facility] to [facility] care coordinator for patient and inform her to call him to speak about future placement and about him accepting the patient back today. CSW reached out to Dean Shepherd at [phone] and informed her that the patient is up for discharge and asked if she could call owner to reiterate that pt needs to be picked up. Dean Shepherd states that she will give owner a call.  Dean Shepherd, LCSWA [phone] ED CSW 01/24/2015 2:54 PM

## 2015-01-24 NOTE — Progress Notes (Signed)
CSW reached out to Group Home owner. He confirms that he is refusing to accept the patient back.   CSW informed owner [phone] that she will call the state due to them know acknowledging and abiding by their 60 day eviction notice. The owner expressed understanding and states that he will call the state also.  CSW reached out to [facility] Representative/ Ardath SaxDoug Berrick regarding the group home and their refusal to accept the patient. However, he did not answer the phone. CSW left a message asking for a call back.  CSW spoke with CSW Teaching laboratory technicianassistant director made assistant director aware of this case.   Gala RomneyDoug Berrick [phone]  Gabrial Poppell Esmond CamperWhitaker, LCSWA [phone] ED CSW 01/24/2015 5:52 PM

## 2015-01-24 NOTE — ED Notes (Signed)
TTS PRESENT TO SPEAK WITH PT 

## 2015-01-24 NOTE — Progress Notes (Signed)
CSW reached out to Group Home Director/ Chrissie NoaWilliam [phone] to inform him that the patient is up for discharge. However, he did not answer the phone.   CSW left a message for Chrissie NoaWilliam to call back. CSW will continue to follow up.  Trish MageBrittney Shila Kruczek, LCSWA [phone] ED CSW 01/24/2015 11:32 AM

## 2015-01-25 MED ORDER — BISMUTH SUBSALICYLATE 262 MG PO CHEW
524.0000 mg | CHEWABLE_TABLET | ORAL | Status: DC | PRN
  Filled 2015-01-25: qty 2

## 2015-01-25 MED ORDER — LORAZEPAM 2 MG/ML IJ SOLN
INTRAMUSCULAR | Status: AC
  Filled 2015-01-25: qty 1

## 2015-01-25 MED ORDER — BISMUTH SUBSALICYLATE 262 MG/15ML PO SUSP
30.0000 mL | ORAL | Status: DC | PRN
  Administered 2015-01-25: 30 mL via ORAL
  Filled 2015-01-25: qty 118

## 2015-01-25 NOTE — Progress Notes (Addendum)
CSW spoke with Angelique Blonderenise of [facility] who states that she is the patient's care coordinator and that Arvella NighChowanna is the patient's Visual merchandisercommunity Navigator with [facility].   Angelique BlonderDenise states that group home has done an " Immediate Discharge" of patient. She states group home says that they do not feel safe with patient around other residents.   Denise/Care Coordinator states that she is looking for crisis placement for patient. CSW asked Angelique BlonderDenise did she start looking today or when the 60 day eviction notice was given. She states that she started looking for placement once she was aware an eviction notice was given. However, she was not able to tell CSW the date of 60 day eviction notice was served.  She states that she is working hard to find placement for pt. However, she does acknowledge that pt has been to multiple group homes in the past and has "burned a lot of bridges".    Denise/ [facility] Care Coordinator [phone]  Marien Manship Esmond CamperWhitaker, LCSWA [phone] ED CSW 01/25/2015 1:16 PM

## 2015-01-25 NOTE — BH Assessment (Signed)
Reassessment 01/25/2015: Patient reassessed today. He denies SI, HI, and AVH's. Patient sts, "I feel a little tired today". Patient's appetite is good and he is sleeping well. Patient is calm and cooperative. Patient continues to remain psych cleared. Patient to return back to group home, however; group home has declined taking him back.  LCSW-Brittany seek alternative placement.

## 2015-01-25 NOTE — Progress Notes (Addendum)
CSW reached out to Brunswick CorporationCommunity Navigator/ Chowanna [phone] to inquire about the date 60 day eviction notice was given to patient.   However, she stated " I don't know what day it is, but they don't got to take him back!"  CW asked if she could give a time frame. Chowanna stated that it may have been " a week or two".   CSW spoke with assistant CSW Interior and spatial designerdirector. He states he will give the state a call regarding group home refusing patient.   CSW will continue to follow up.  Trish MageBrittney Skyler Carel, LCSWA [phone] ED CSW 01/25/2015 12:53 PM

## 2015-01-25 NOTE — Progress Notes (Signed)
CSW reached out to patient's mother/Dawn Manson PasseyBrown who is also guardian at [phone] to discuss current living situation and group home refusal.   CSW asked mom/guardian her thoughts and would it be an option for patient to go home with her until another group home placement is found. However, mom made it clear that this is not an option.   Mom is currently living in [place].   Trish MageBrittney Shawntina Diffee, LCSWA [phone] ED CSW 01/25/2015 5:23 PM

## 2015-01-25 NOTE — Progress Notes (Signed)
CSW reached out to [facility] to inquire if they had any male beds. However, staff member states that there are no available beds open.   Trish MageBrittney Jaxston Chohan, LCSWA [phone] ED CSW 01/25/2015 7:10 PM

## 2015-01-26 ENCOUNTER — Emergency Department (HOSPITAL_COMMUNITY): Payer: Medicaid Other

## 2015-01-26 MED ORDER — ACETAMINOPHEN 325 MG PO TABS: 650.0000 mg | ORAL_TABLET | Freq: Once | ORAL | Status: DC

## 2015-01-26 MED ORDER — ACETAMINOPHEN 80 MG PO CHEW: 80.0000 mg | CHEWABLE_TABLET | Freq: Once | ORAL | Status: DC

## 2015-01-26 MED ORDER — ACETAMINOPHEN 325 MG PO TABS
650.0000 mg | ORAL_TABLET | Freq: Four times a day (QID) | ORAL | Status: DC | PRN
  Administered 2015-01-31: 650 mg via ORAL
  Filled 2015-01-26 (×2): qty 2

## 2015-01-26 NOTE — ED Notes (Addendum)
Pt is awake and eating breakfast.pt c/o left sided head pain which he rates a 10/10. Pt does not appear in distress. Phoned EDP to make him aware at 9:30pm. Pt stated,"I need a CAT scan and is the doctor going to see me." Pt was reassured he would remain safe and someone would evaluate him.Pt co the head pain starts on the left side behind his ear and radiates around the base of the head. No blurred vision.he said ,"I have a shunt."  Pt s/p a shower . Tolerated well,.Pt remains a 1;1. EDP came to evaluate the pt . Pt has been in the hallway laughing with the other staff members and does not appear in any distress. (10:30am) -4:30pm-pt was standing in the doorway of his room and stated his shunt hurt. Pt fell over and hit his head on the computer. Pt was placed back in bed and vital signs were obtained. Dr. Rennis Chrisjacobowitz came to see the pt. Pt will go for a CAT scan . 5:15p-Pt is back from CAT scan. Tolerated well.

## 2015-01-26 NOTE — Progress Notes (Signed)
   01/26/15 1600  Clinical Encounter Type  Visited With Patient  Visit Type Initial;Psychological support;Spiritual support;Behavioral Health;ED  Referral From Nurse  Consult/Referral To Chaplain  Spiritual Encounters  Spiritual Needs Emotional;Other (Comment);Prayer Veterinary surgeon(Pastoral Conversation)  Stress Factors  Patient Stress Factors Other (Comment) (Living Situation)   The Chaplain visited with the patient upon referral by the nurse. The patient was in bed asleep upon the Chaplain's arrival, but woke up as soon as she entered the room. The conversation was brief, but the patient requested that the Chaplain pray that he finds another group home. Chaplain interventions included pastoral conversation, spiritual assessment and prayer.   The Chaplain will follow-up upon request.   Chaplain Clint BolderBrittany Jeny Nield M.Div.

## 2015-01-26 NOTE — ED Provider Notes (Signed)
4:30 PM I was called to bedside as patient had fallen and struck his head. He complains of pain at left and right parietal areas and states "my shoulder hurts. Patient is alert ambulates with shuffling gait, his baseline, moves all extremities. No other associated symptoms. CT head ordered  CT report reviewed by me.  605 pm pt alert , resting comfortably in bed. No distress  Doug SouSam Zan Triska, MD 01/26/15 [phone]

## 2015-01-26 NOTE — BH Assessment (Signed)
Patient was reassessed by TTS.   Patient states that he is currently suicidal with plans "slitting my throat, cutting my wrist, or stabbing myself in the leg." Patient states that he is suicidal due to not liking his group home. Patient states that if he could live somewhere else he would not be suicidal.  Patient denies HI and AVH.   Informed CSW and psychiatric team of patient complaints. Patient is psychiatrically cleared and is awaiting CSW placement at a different facility at this time.

## 2015-01-26 NOTE — Progress Notes (Signed)
CSW reached out to [facility] Interior and spatial designerDirector and inquired if he had any open male beds.   He states that he does have bed availably at this time. He request that CSW send him clinical information for review. CSW will send clinical information for review.  Trish MageBrittney Emeli Goguen, LCSWA [phone] ED CSW 01/26/2015 12:41 PM

## 2015-01-26 NOTE — Progress Notes (Signed)
CSW spoke with patient's care coordinator SopchoppyDenise. Angelique BlonderDenise states that she is still searching for placement for patient.   Angelique BlonderDenise states " We're exploring 2 potential placements". Angelique BlonderDenise states that one of the potential placements is a facility named [facility]. She states that she and her [facility] team are in the process of talking with both facilities, but is hopeful that one of the potential placements will be able to take by nest week.  CSW asked Angelique BlonderDenise if she needed any paperwork from her. However, she states that she does not need anything from CSW at this time.   CSW will continue to follow up.  Trish MageBrittney Uziah Sorter, LCSWA [phone] ED CSW 01/26/2015 11:59 AM

## 2015-01-27 LAB — COMPREHENSIVE METABOLIC PANEL
ALBUMIN: 3.7 g/dL (ref 3.5–5.0)
ALK PHOS: 82 U/L (ref 38–126)
ALT: 19 U/L (ref 17–63)
AST: 21 U/L (ref 15–41)
Anion gap: 9 (ref 5–15)
BILIRUBIN TOTAL: 0.5 mg/dL (ref 0.3–1.2)
BUN: 15 mg/dL (ref 6–20)
CALCIUM: 9.1 mg/dL (ref 8.9–10.3)
CO2: 26 mmol/L (ref 22–32)
CREATININE: 0.98 mg/dL (ref 0.61–1.24)
Chloride: 107 mmol/L (ref 101–111)
GFR calc Af Amer: >60 mL/min (ref >60)
GFR calc non Af Amer: >60 mL/min (ref >60)
GLUCOSE: 113 mg/dL — AB (ref 65–99)
Potassium: 4.3 mmol/L (ref 3.5–5.1)
Sodium: 142 mmol/L (ref 135–145)
TOTAL PROTEIN: 7.1 g/dL (ref 6.5–8.1)

## 2015-01-27 LAB — CBC WITH DIFFERENTIAL/PLATELET
Basophils Absolute: 0 10*3/uL (ref 0.0–0.1)
Basophils Relative: 0 %
Eosinophils Absolute: 0.2 10*3/uL (ref 0.0–0.7)
Eosinophils Relative: 3 %
HEMATOCRIT: 42.1 % (ref 39.0–52.0)
HEMOGLOBIN: 13.7 g/dL (ref 13.0–17.0)
LYMPHS ABS: 2.3 10*3/uL (ref 0.7–4.0)
LYMPHS PCT: 36 %
MCH: 28 pg (ref 26.0–34.0)
MCHC: 32.5 g/dL (ref 30.0–36.0)
MCV: 85.9 fL (ref 78.0–100.0)
MONO ABS: 0.8 10*3/uL (ref 0.1–1.0)
MONOS PCT: 13 %
NEUTROS ABS: 3 10*3/uL (ref 1.7–7.7)
NEUTROS PCT: 48 %
Platelets: 249 10*3/uL (ref 150–400)
RBC: 4.9 MIL/uL (ref 4.22–5.81)
RDW: 13.7 % (ref 11.5–15.5)
WBC: 6.3 10*3/uL (ref 4.0–10.5)

## 2015-01-27 LAB — LIPASE, BLOOD: Lipase: 26 U/L (ref 11–51)

## 2015-01-27 MED ORDER — POLYETHYLENE GLYCOL 3350 17 G PO PACK
17.0000 g | PACK | Freq: Every day | ORAL | Status: DC
  Administered 2015-01-27 – 2015-02-02 (×7): 17 g via ORAL
  Filled 2015-01-27 (×7): qty 1

## 2015-01-27 NOTE — BH Assessment (Signed)
Patient was reassessed by TTS.   Patient is alert and oriented and sitting in the bed upright. Patient states that his "tummy hurts." Patient states that he feels "good about going to a new place." Patient requested that he be placed "somewhere with a one on one, I feel safer with a one on one." Information reported to the CSW. Patient denies current SI and HI. Patient denies AVH and does not appear to be responding to internal stimuli.    Patient remains psychiatrically cleared and is awaiting placement. CSW informed of information in the assessment.    Davina PokeJoVea Zedric Deroy, LCSW Therapeutic Triage Specialist [facility] 01/27/2015 11:05 AM

## 2015-01-27 NOTE — ED Notes (Signed)
Nurse drawing labs. 

## 2015-01-27 NOTE — Progress Notes (Addendum)
Pt c/o of a stomach ache and was given pepto bismol; and gingerale. He was also given saltine crackers to eat. Pt appears depressed this am . He remains a 1:1 and remains safe . Pt ate 100% of his breakfast and then decided to nap. He remains cooperative and pleasant. Pt was given OJ to drink . (1:10pm) Per charge pt can listen to music on the computer so long as it is closely monitored. Pt appears in good spirits about this. He requested to  Listen to country western music. 2pm_pt c/o abd pain around the umbilical  area. Positive hyperactive BS throughout. Pt stated his last BM was this am and was normal PA, Irving BurtonEmily, evaluated the pt. Pt c/o pain where ever his abd and back were palpated. No rebound tenderness noted.  Pt did eat 100% of his lunch and tolerated well. Pt at this time appears comfortable and is watching TV. He remains 1:1 with a Comptrollersitter. Pt asked the nurse,"Can I get some x-rays?" Instructed pt that lots of x-rays are not healthy . EDP made aware by PA of the pts complaints. Pt had blood work drawn and tolerated well.

## 2015-01-27 NOTE — ED Notes (Signed)
Resting with eyes closed.  Difficult to arouse.

## 2015-01-27 NOTE — ED Notes (Signed)
Continues to be difficult to arouse.  Report to oncoming RN scheduled med holds.
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
## 2015-01-28 MED ORDER — PROPRANOLOL HCL 20 MG PO TABS
20.0000 mg | ORAL_TABLET | Freq: Two times a day (BID) | ORAL | Status: DC
  Administered 2015-01-28 – 2015-02-02 (×10): 20 mg via ORAL
  Filled 2015-01-28 (×11): qty 1

## 2015-01-28 NOTE — BH Assessment (Signed)
Patient was reassessed by TTS.   Patient was laying in bed watching Sponge Nadine CountsBob and turned to face this Clinical research associatewriter during the assessment. Patient denies SI/HI and AVH and does not appeasr to be responding to internal stimuli. Patient asked when he was leaving and going to another group home. Patient was informed that the CSW would be working on placement and would inform him of any updates. Patient states that his "tummy" still hurts. Patient was calm and cooperative.   Patient remains psychiatrically cleared and is awaiting CSW placement.   Davina PokeJoVea Amad Mau, LCSW Therapeutic Triage Specialist [facility] 01/28/2015 10:05 AM
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
## 2015-01-29 NOTE — BHH Counselor (Signed)
Reassessment note:  Pt was pleasant upon approach, smiling and asked "have yall found me a place to stay yet?" He denies SI, HI or A/V at this time and does not have any complaints. He states that he is having minimal depression and anxiety and has been sleeping and eating well this past weekend. Clinician states that social work will follow up with updates about placement.   Kateri PlummerKristin Santonio Speakman, M.S., LPCA, [place]ASA, [facility] Licensed Professional Counselor Associate  Triage Specialist  [facility]  Therapeutic Triage Services Phone: [phone] Fax: [phone]

## 2015-01-29 NOTE — Progress Notes (Signed)
CSW spoke with Shawnie DapperErica Hendrix of [facility], which is [place].  She states that she will come to [facility] to interview the patient for possible placement.  Trish MageBrittney Amrutha Avera, LCSWA [phone] ED CSW 01/29/2015 1:50 PM

## 2015-01-29 NOTE — Progress Notes (Signed)
CSW reached out to patient's Care Coordinator OceanaDenise. She states that she will be out of the office for the entire week, but states that she [place] who is referral coordinator will be working on the patient's case this week.  Angelique BlonderDenise states "We're working on getting him out of here". According to [facility], her main lead is [facility]. She states that it is a possibility this facility could accept the patient this week... Possibly tomorrow. She states that they have been working hard to try to assist with placing the patient.  Also, she states that she has [facility]. However, she says that it is a low possibility that the patient will be able to go to this facility due to the extensive waiting list.  Trish MageBrittney Carrine Kroboth, LCSWA [phone] ED CSW 01/29/2015 6:29 PM

## 2015-01-29 NOTE — Progress Notes (Signed)
Pt said he had a good day. He was smiling during our visit today. He said they found a group home for him. He was thankful for visit but noted he wanted to watch tv. Please page if additional support is needed. Chaplain Elmarie Shileyamela Carrington Holder   01/29/15 0700  Clinical Encounter Type  Visited With Patient

## 2015-01-30 NOTE — Progress Notes (Signed)
CSW spoke with Norwood LevoJoe Whaley who is the Production designer, theatre/television/filmManager of [facility]. He states that he spoke with the patient's substitute care coordinator Marisue IvanLiz. He states that the care coordinator informed him that it would be okay to meet the patient tomorrow and pick the patient up Friday.   CSW explained to Gabriel RungJoe that the patient is medically clear and psych clear and that CSW and patient can do a meet and greet tomorrow and also discharge him to the new facility tomorrow. Joe states that he would not have a problem with this. CSW will reach out to [facility] tomorrow and inform them that the patient is able to leave tomorrow.  Trish MageBrittney Derral Colucci, LCSWA [phone] ED CSW 01/30/2015 7:28 PM

## 2015-01-30 NOTE — BH Assessment (Signed)
Reassessment 01/30/2015: Patient reassessed today. He denies SI, HI, and AVH's.  Patient's appetite is good and he is sleeping well. Patient is calm and cooperative. Patient continues to remain psych cleared. Patient to return back to group home, however; group home has declined taking him back. LCSW-Brittany seek alternative placement.

## 2015-01-30 NOTE — ED Notes (Signed)
Report given to Janet, RN

## 2015-01-30 NOTE — Progress Notes (Signed)
Alcario DroughtErica and Kim of [facility] to [facility] patient. No concerns were raised during the interview. However, Erica/Director did make it clear that their facility is currently in the process of interviewing other potential residents at this time. She states that the facility currently has 1 bed opening at this time, but it is possible that they will have 2 bed openings.   Alcario Droughtrica states that their facility will consider the patient and that they will discuss patient with the team. She informed patient that this would be a process. Alcario Droughtrica also mentioned that more than likely if they accepted the patient it would be after the holidays due to other staff and residents going out of town.  Alcario Droughtrica states that if the patient is not accepted to their group home, he will be able to come to their day program called [facility]. She will review patient with her staff and follow up with CSW about acceptance or denial of patient.   Trish MageBrittney Shakemia Madera, LCSWA [phone] ED CSW 01/30/2015 2:02 PM

## 2015-01-30 NOTE — ED Notes (Addendum)
Pt is in the hallway and appears pleasant this am. He is talkative. Pt remains a 1:1 for safety.10am-Pt ph[place] his grandfather [place] and left a message . Pt stated,"I am tired of being here and want to leave. When can I leave here?"Pt stated,"I want to get out of here before Thanksgiving and have Malawiturkey, squash casserole and sweet potatoes."11:45a-Pt is scheduled for an interview today for possible placement. 12noon - Representatives from [facility] speaking with [facility] concerning placement. Pt is cooperative and pleasant. Pt at times can be attention seeking and intrusive. He has to be redirected back to his room. Report to Aliana.

## 2015-01-30 NOTE — Progress Notes (Signed)
CSW spoke with Dean Shepherd who is a Care Coordinator with Dean Shepherd who is assisting with finding placement for patient while his assigned Care Coordinator Dean Shepherd is out for [employer]iday Vacation.  Dean Shepherd informed CSW that the patient [facility] accepted [facility] [place]. However, Dean Shepherd states that [facility] has to complete authorization of patient going to group home which will consist of working out various logistics, payment information, and getting patient's belongings from his previous group home.  Dean Shepherd states that Dean Shepherd/Patient's community guide plan to meet with the patient today along with his new ALF/ [facility]. Dean Shepherd states that goal is for the patient to leave tomorrow, but that will depend on how fast they are able to get his things from his previous group home. Dean Shepherd states that hopefully the patient will be able to go to his new group home tomorrow, but definitely by the end of the week. The patient has been accepted, but he is not leaving today. CSW will not inform the patient that he has been accepted to his new facility until he meets with facility.  CSW asked care coordinator Dean Shepherd if she needs any help or any additional documents from CSW. However, she states that she does not need anything at this time.   CSW made nurse aware. CSW made NP aware.    Dean Shepherd, LCSWA [phone] ED CSW 01/30/2015 2:16 PM

## 2015-01-30 NOTE — ED Notes (Signed)
Pt denies SI/HI/AV.  Per Psych notes, the Pt has been psychiatrically cleared.  However, while being interviewed by a group home earlier, the Pt reported that he likes 1-on-1 attention.  This RN is concerned that if we d/c the sitter case, then the Pt will endorse SI again.

## 2015-01-31 NOTE — Progress Notes (Signed)
CSW spoke with patient about new group home placement. CSW provided supportive counseling for patient. Patient expressed that he felt comfortable  with meeting new group home and discharging with them.  CSW spoke with patient about his awareness that he would be discharged to a facility and not with his mother. Patient expressed that he is aware that he will not be going home with mom. Patient states that he is aware he will be going to a new facility.   CSW provided education to patient regarding being respectful to others and good behavior.   The patient has [facility] CSW expressed to patient that she was proud of him in order to provide positive reinforcement.   Trish MageBrittney Eural Holzschuh, LCSWA [phone] ED CSW 01/31/2015 1:03 PM

## 2015-01-31 NOTE — ED Notes (Signed)
Sitter made rn aware that pt tried to "sit down/ fall". rn went in to talk to pt, and told pt that if he sits on the ground he will lose his tv privileges.

## 2015-01-31 NOTE — Progress Notes (Signed)
CSW spoke with Marisue IvanLiz and HomerNarissa of [facility]. CSW informed both care coordinators that she received a call from group home manager Joe or [facility] stating that he was advised not to pick the patient up tomorrow, but instead to come and meet the patient and pick him up the next day.  CSW informed Marisue IvanLiz and Hermine Messickarissa that Gabriel RungJoe stated that he would be able to come and pick the patient up, and that doing a meet and greet and coming back on a Friday to pick the patient up would not be a great plan for patient. CSW informed both care coordinators that the patient is psychiatrically clear and medically clear and that it is not in the patient's best interest to hold him in [facility]. CSW made it clear that he needs to be in the community thriving.  Marisue IvanLiz states that she was not the care coordinator that spoke with Joe. She states she will discuss this patient with her staff and call CSW back.  CSW reached out to Joe/[facility] to inform him of discussion with [facility] Care Coordinators. However,he did not answer the phone. CSW left a voice mail asking for a call back.  Trish MageBrittney Si Jachim, LCSWA [phone] ED CSW 01/31/2015 12:13 PM

## 2015-01-31 NOTE — ED Notes (Signed)
Pt standing in doorway, talking with sitter and nurse. Calm and cooperative.  In no acute distress. Denies needs.

## 2015-01-31 NOTE — ED Notes (Signed)
1 pt belonging bag in locker #33 

## 2015-01-31 NOTE — ED Notes (Addendum)
Pt reports he fell, sitter unsure what happened. Pt said he was getting up from bed to go to bathroom and slipped, fell forward and hit his head. Pt reports "his shunt hurts". Pt had fall last week with complaint of hitting head and CT was negative.   Pt went through motion of fall, pt was sitting on right side of bed, went down to his knees and hands. Rn asked again and pt restated he hit his head. No injury/scratches noted to pts face. Pt afterwards got up from bed and walked to bathroom.   md made aware.

## 2015-01-31 NOTE — Progress Notes (Signed)
CSW spoke with Joe who is the manager of the patient's new group home. He states that he does not have staff put in place to pick the patient up today. He state that his staff went out of town and that he "mis-spoke" yesterday when he said he would be able to pick the patient up today.  Joe states that he will come to pick the patient up Friday. CSW Chiropodistassistant director is aware.  Trish MageBrittney Hartleigh Edmonston, LCSWA [phone] ED CSW 01/31/2015 4:09 PM

## 2015-02-01 NOTE — ED Notes (Signed)
Meal given to patient. Pt remains calm and cooperative. Pt has been appropriate with all staff as well as other patients today. Pt has been allowed to listen to music in room. Sitter remains at door.

## 2015-02-01 NOTE — Progress Notes (Signed)
   02/01/15 1200  Clinical Encounter Type  Visited With Patient  Visit Type Follow-up;Psychological support;Spiritual support;Behavioral Health;ED  Referral From Nurse  Consult/Referral To Chaplain  Spiritual Encounters  Spiritual Needs Emotional;Other (Comment) (Pastoral Conversation)   The chaplain has been visiting with this patient to give him emotional/psychological/spiritual support. The Chaplain gave the patient a neck pillow that had been approved by medical staff.  The patient asked the Chaplain to get him cookies. Medical staff said that he could have graham crackers. The Chaplain will follow-up with the patient.    Chaplain Clint BolderBrittany Skilar Marcou M.Div

## 2015-02-01 NOTE — BH Assessment (Signed)
Reassessment 02/01/2015: Patient reassessed today. He denies SI, HI, and AVH's. Patient's appetite is good and he is sleeping well. Patient is calm and cooperative. Patient continues to remain psych cleared. Patient to return back to group home, however; group home has declined taking him back. CSW-Brittany has sought alternative placement at a new group home. Per CSW last note on 01/31/2015, "Spoke with Joe who is Engineer, sitethe manager of the patient's new group home. He states that he does not have staff put in place to pick the patient up today. He state that his staff went out of town and that he "mis-spoke" yesterday when he said he would be able to pick the patient up today". CSW will follow up with patient's placement needs tomorrow.

## 2015-02-01 NOTE — ED Notes (Signed)
Change of shift 11/24,  Pt alert and oriented in NAD,  Sitter at bedside

## 2015-02-02 DIAGNOSIS — F913 Oppositional defiant disorder: Secondary | ICD-10-CM | POA: Diagnosis not present

## 2015-02-02 MED ORDER — OLANZAPINE 15 MG PO TABS: 15.0000 mg | ORAL_TABLET | Freq: Two times a day (BID) | ORAL | Status: DC

## 2015-02-02 MED ORDER — CLONAZEPAM 1 MG PO TABS: 1.0000 mg | ORAL_TABLET | Freq: Two times a day (BID) | ORAL | Status: AC

## 2015-02-02 MED ORDER — PROPRANOLOL HCL 20 MG PO TABS: 20.0000 mg | ORAL_TABLET | Freq: Two times a day (BID) | ORAL | Status: AC

## 2015-02-02 MED ORDER — DIVALPROEX SODIUM ER 250 MG PO TB24: 750.0000 mg | ORAL_TABLET | Freq: Every day | ORAL | Status: DC

## 2015-02-02 MED ORDER — AMANTADINE HCL 100 MG PO CAPS: 100.0000 mg | ORAL_CAPSULE | Freq: Two times a day (BID) | ORAL | Status: AC

## 2015-02-02 MED ORDER — DIVALPROEX SODIUM ER 500 MG PO TB24: 500.0000 mg | ORAL_TABLET | Freq: Every day | ORAL | Status: AC

## 2015-02-02 MED ORDER — FLUOXETINE HCL 40 MG PO CAPS: 40.0000 mg | ORAL_CAPSULE | Freq: Every day | ORAL | Status: AC

## 2015-02-02 MED ORDER — LAMOTRIGINE 100 MG PO TABS: 100.0000 mg | ORAL_TABLET | Freq: Every morning | ORAL | Status: AC

## 2015-02-02 NOTE — Progress Notes (Signed)
CSW spoke with group home manager [facility] who [facility] [place] [facility] [place]. [facility] states that [facility] 6:30 pm tonight.   CSW made NP aware. CSW made nurse aware.   CSW made patient aware that he will be leaving tonight. However, she will not tell him the exact time that Gabriel RungJoe is expecting to come [facility] to avoid the patient becoming overly anxious.   Trish MageBrittney Gray Maugeri, LCSWA [phone] ED CSW 02/02/2015 4:15 PM

## 2015-02-02 NOTE — Progress Notes (Signed)
CSW spoke with Joe who is the manager of the patient's new group. CSW called to inquire what time he would be coming to [facility] to [facility] the [facility]. [facility] not [facility] for [facility] patient and has not received payment.   Joe also states that he wants to know if the patient will receive medication. CSW consulted with NP who states that the patient will receive medication scripts and discharge paperwork upon leaving. CSW informed Joe.  CSW called patient's community guide/ [place]. She states that she will work on getting the patient's clothes and speak with Gabriel RungJoe about it and about payment information.  Crista CurbBrittney Kaleb Linquist, [place] [phone] ED CSW

## 2015-02-02 NOTE — Consult Note (Signed)
Psychiatric Specialty Exam: Physical Exam  ROS  Blood pressure 130/68, pulse 77, temperature 98.3 F (36.8 C), temperature source Oral, resp. rate 18, SpO2 98 %.There is no weight on file to calculate BMI.  General Appearance: Casual and Fairly Groomed  Patent attorneyye Contact::  Good  Speech:  Clear and Coherent and Normal Rate  Volume:  Normal  Mood:  Euthymic  Affect:  Congruent  Thought Process:  Coherent, Goal Directed and Intact  Orientation:  Full (Time, Place, and Person)  Thought Content:  WDL  Suicidal Thoughts:  No  Homicidal Thoughts:  No  Memory:  Immediate;   Good Recent;   Good Remote;   Good  Judgement:  Fair  Insight:  Fair  Psychomotor Activity:  Normal  Concentration:  Good  Recall:  NA  Fund of Knowledge:  Fair  Language:  Good  Akathisia:  NA  Handed:  Right  AIMS (if indicated):     Assets:  Desire for Improvement  ADL's:  Intact  Cognition:  Impaired,  Moderate  Sleep:      Patient has been accepted by a new [facility].  Patient is excited about going there.  He states that he is willing to work with staff members and follow policies.  Patient denies SI/HI/AVH.   He has not exhibited anger out burst or had any behavior issues. He is discharged now with prescription for his medications.  Oppositional defiant disorder   Plan:Discharge.  Dahlia ByesJosephine Onuoha   PMHNP-BC  Patient seen and I agree with treatment and plan  Jamse BelfastDeborah Ross M.D.

## 2015-02-02 NOTE — ED Notes (Signed)
Pt has continued to sleep and remained calm,  Pt took 2200 meds, he states he is to leave on Friday ,which will be if placement is found

## 2015-02-02 NOTE — BH Assessment (Signed)
Patient was reassessed by TTS.   Patient denies SI/HI and AVH. Patient was sitting in his chair looking at the menu. Patient did not look up to answer questions. Patient asked if he was leaving today and was referred back to the CSW to answer those questions. Patient does not appear to be responding to internal stimuli.

## 2015-02-02 NOTE — Progress Notes (Deleted)
CSW spoke with La of [facility] and informed her that I did not receive a portion of diversion paperwork. La states that she will be able to complete the document and fax it back to CSW.   CSW is awaiting to receive document.  Anjelita Sheahan, LCSWA [phone] ED CSW 02/02/2015 11:55 AM   

## 2015-02-02 NOTE — BHH Suicide Risk Assessment (Signed)
Suicide Risk Assessment  Discharge Assessment   [facility]   Demographic Factors:  Male, Caucasian, Low socioeconomic status and Unemployed  Total Time spent with patient: 20 minutes  Musculoskeletal: Strength & Muscle Tone: within normal limits Gait & Station: normal Patient leans: N/A  Psychiatric Specialty Exam:     Blood pressure 130/68, pulse 77, temperature 98.3 F (36.8 C), temperature source Oral, resp. rate 18, SpO2 98 %.There is no weight on file to calculate BMI.  General Appearance: Casual and Fairly Groomed  Patent attorneyye Contact::  Good  Speech:  Clear and Coherent and Normal Rate  Volume:  Normal  Mood:  Euthymic  Affect:  Congruent  Thought Process:  Coherent, Goal Directed and Intact  Orientation:  Full (Time, Place, and Person)  Thought Content:  WDL  Suicidal Thoughts:  No  Homicidal Thoughts:  No  Memory:  Immediate;   Good Recent;   Good Remote;   Good  Judgement:  Fair  Insight:  Fair  Psychomotor Activity:  Normal  Concentration:  Good  Recall:  NA  Fund of Knowledge:  Fair  Language:  Good  Akathisia:  NA  Handed:  Right  AIMS (if indicated):     Assets:  Desire for Improvement  ADL's:  Intact  Cognition:  Impaired,  Moderate        Has this patient used any form of tobacco in the last 30 days? (Cigarettes, Smokeless Tobacco, Cigars, and/or Pipes) N/A  Mental Status Per Nursing Assessment::   On Admission:     Current Mental Status by Physician: NA  Loss Factors: NA  Historical Factors: NA  Risk Reduction Factors:   Positive social support  Continued Clinical Symptoms:  Severe Anxiety and/or Agitation Depression:   Aggression More than one psychiatric diagnosis  Cognitive Features That Contribute To Risk:  Polarized thinking    Suicide Risk:  Minimal: No identifiable suicidal ideation.  Patients presenting with no risk factors but with morbid ruminations; may be classified as minimal risk based on the  severity of the depressive symptoms  Principal Problem: Oppositional defiant disorder Discharge Diagnoses:  Patient Active Problem List   Diagnosis Date Noted  . Intermittent explosive disorder [F63.81] 06/29/2014    Priority: High  . Aggressive behavior [F60.89] 07/05/2013    Priority: High  . Oppositional defiant disorder [F91.3]     Priority: Medium  . Suicidal ideations [R45.851]   . Disruptive mood dysregulation disorder (HCC) [F34.81] 06/29/2014  . Mental retardation [F79]   . 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   Specialty:  Emergency Medicine   Why:  As needed This is your assigned Medicaid [place] access doctor If you prefer another contact DSS [phone]   Contact information:   [address] [place] [address] [phone]       Follow up with Medicaid [place] Access Covered Patient .   Why:  [place] Transportation assists you to your dr appointments: [phone] or [phone] Transportation Supervisor [phone] DSS assigned your doctor *You will receive a bill if you go to any other Dr not assigned to you   Contact information:   As a Medicaid client you MUST contact DSS/SSI each time you change address, move to another county or another state to keep your address updated [place]: [place]: [phone] (main) [email or web address].ncdhhs.gov/ [address] [place], [place] [address]  Plan Of Care/Follow-up recommendations:  Activity:  As tolerated Diet:  regular  Is patient on multiple antipsychotic therapies at discharge:  No   Has Patient had three or more failed trials of antipsychotic monotherapy by history:  No  Recommended Plan for Multiple Antipsychotic Therapies: NA    Earney Navy   PMHNP-BC 02/02/2015, 4:44 PM   Patient seen and I agree with treatment plan  Diannia Ruder M.D.

## 2015-02-02 NOTE — ED Notes (Addendum)
Pt appears in good spirits this am stating he is leaving today. Pt ate 100% of his breakfast. He is cooperative and pleasant. Pt remains with a 1:1-2:15P- pt remains cooperative. He continues to ask when he will leave. Pt ate 100% of his lunch and tolerated well. 2:45p_pt is playing uno with the pt. 7;20p- Pt left with Jomarie LongsJoseph and with Caryn BeeKevin.

## 2015-03-24 IMAGING — CR DG SHOULDER 2+V*L*
4 series · 4 of 4 positions shown · non-contrast
Comparison: None.

CLINICAL DATA: Fall, shoulder injury today

EXAM:
LEFT SHOULDER - 2+ VIEW

[w shoulder external left]
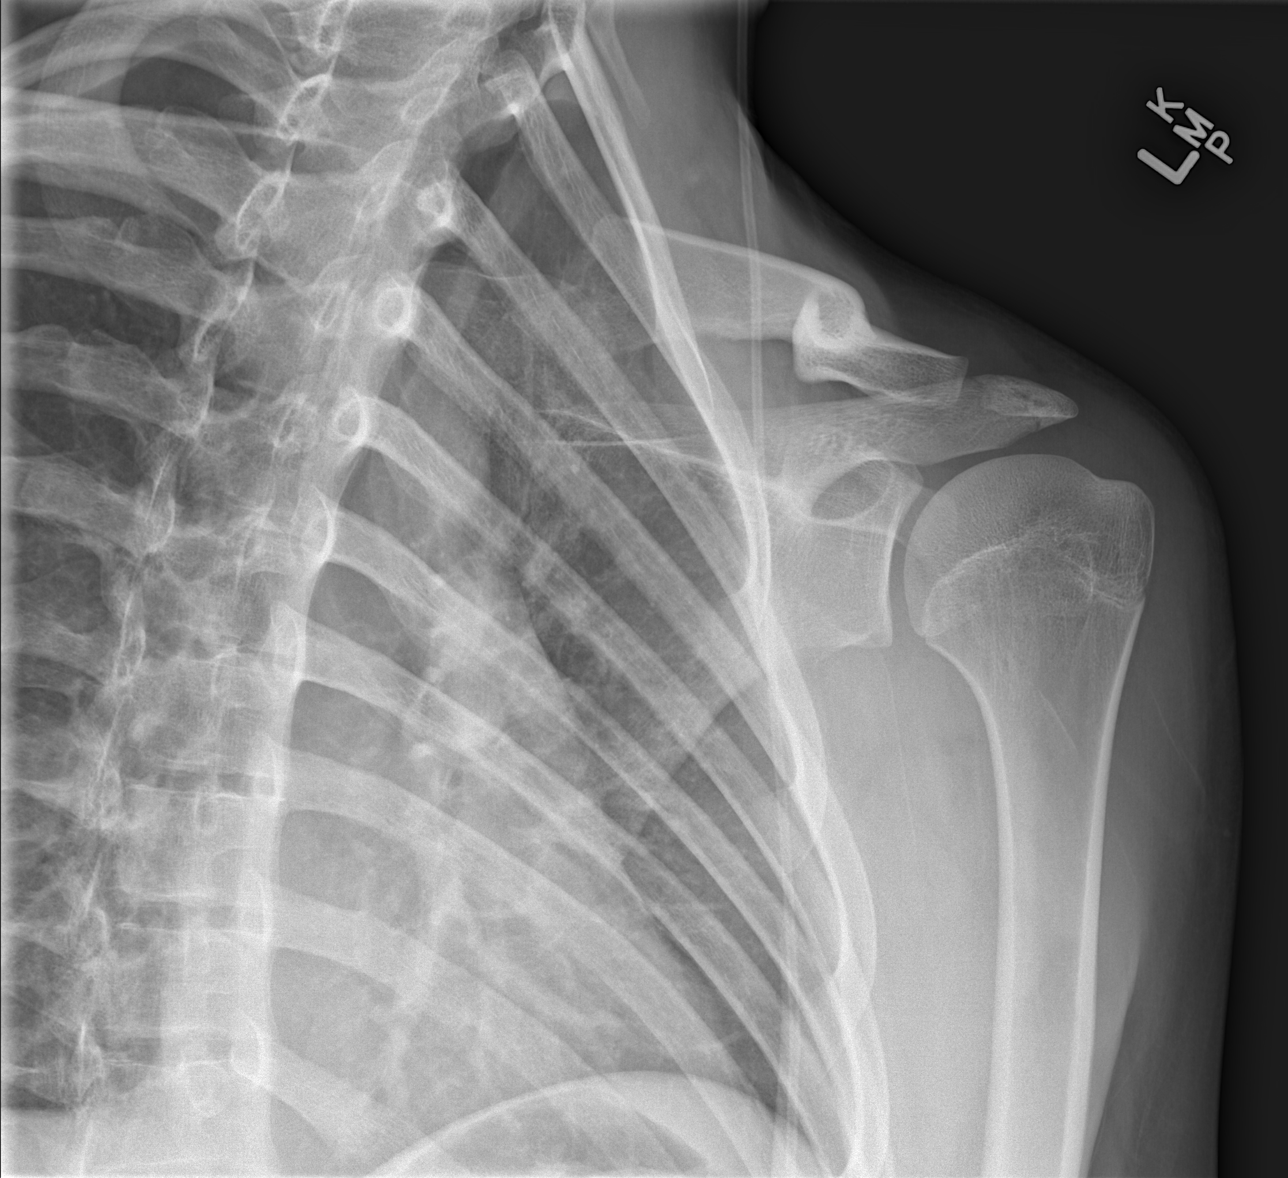

[w shoulder y-view left (1 of 2)]
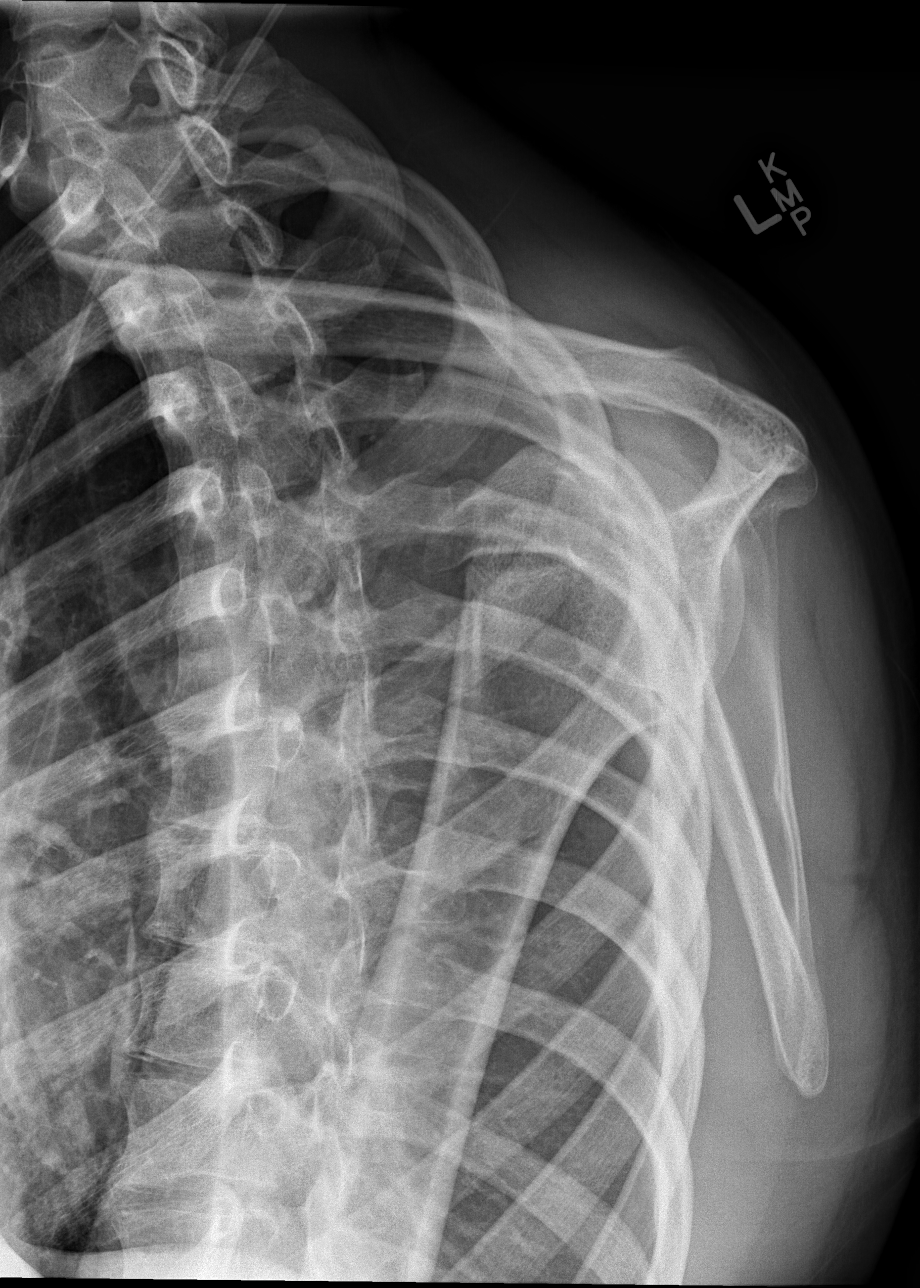

[w shoulder y-view left (2 of 2)]
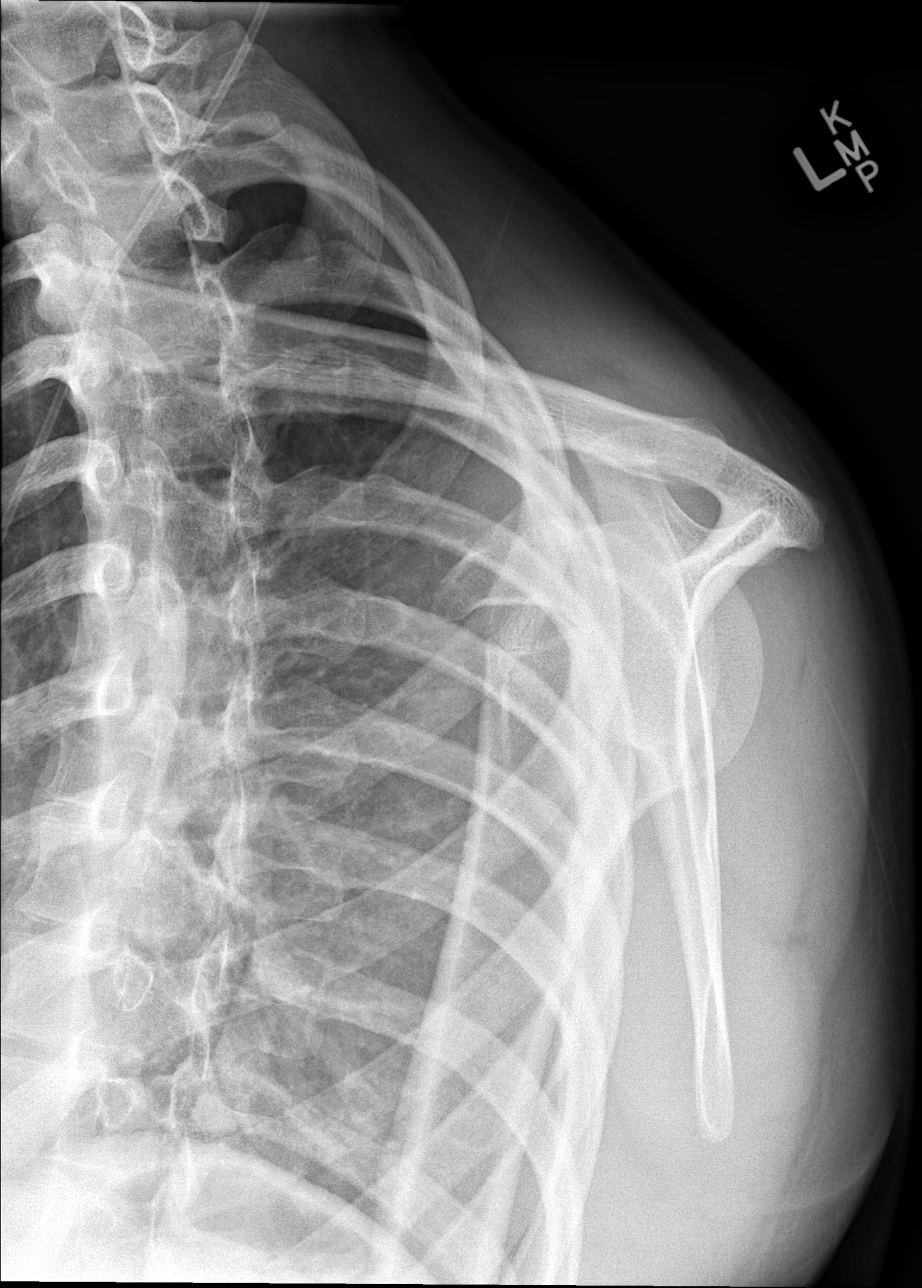

[x shoulder axillary left]
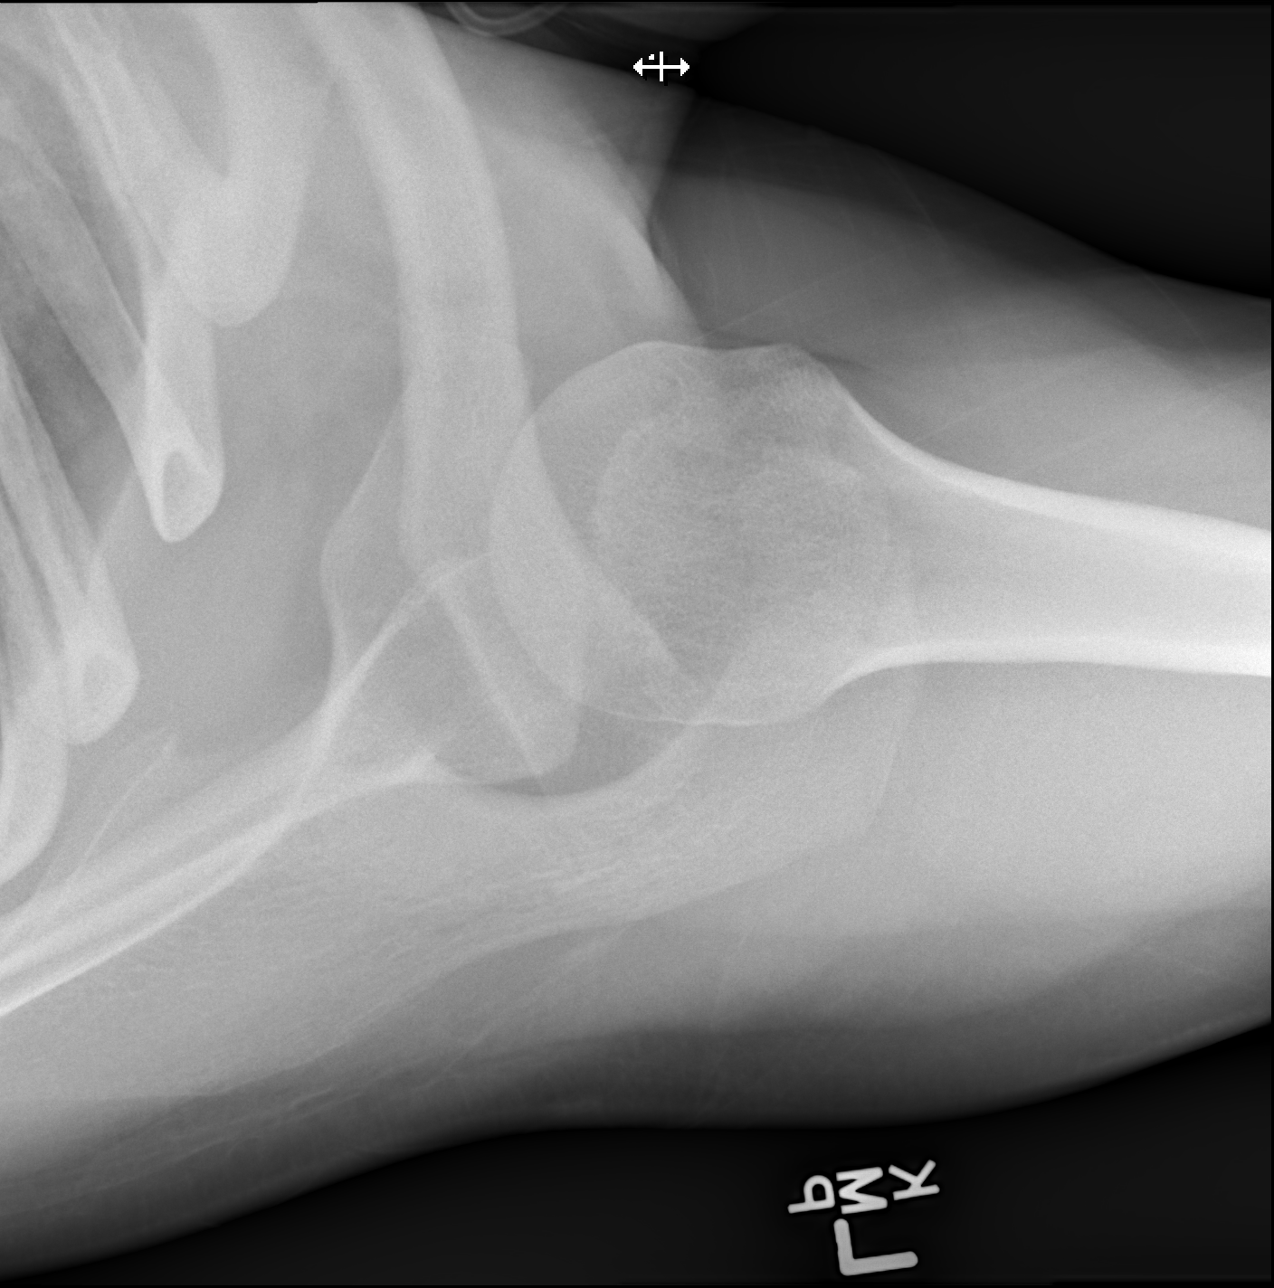

[4 of 4 positions shown; findings below may reference images not displayed]

FINDINGS: Four views of the left shoulder submitted. No displaced fracture or
subluxation. On frontal view of there is a subtle lucent line
inferior aspect of acromion. Nondisplaced fracture cannot be
excluded. Clinical correlation is necessary.
IMPRESSION: No displaced fracture or subluxation. On frontal view of there is a
subtle lucent line inferior aspect of acromion. Nondisplaced
fracture cannot be excluded. Clinical correlation is necessary.

## 2015-11-19 IMAGING — CT CT HEAD W/O CM
2 series · 16 of 30 positions shown, 19 images · non-contrast
Comparison: Head CT dated 01/08/2015.

CLINICAL DATA: Psychiatric admit, multiple falls.

EXAM:
CT HEAD WITHOUT CONTRAST
TECHNIQUE: Contiguous axial images were obtained from the base of the skull
through the vertex without intravenous contrast.

[Series 2: head w/o · axial · non-contrast · 0.42mm/px · z∈[-106,+24]mm · 9 of 34 slices shown, 12 images]
[im 4/34  brain]
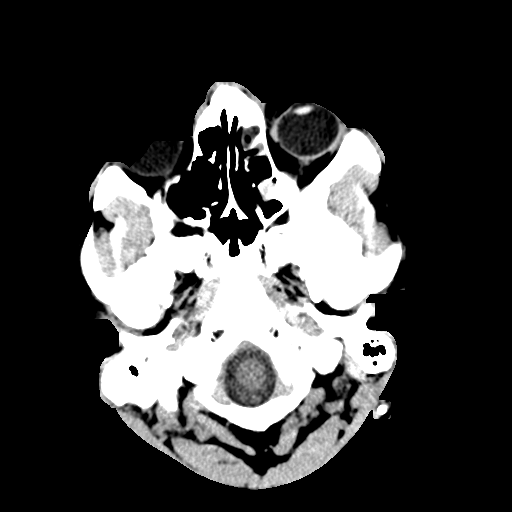
[im 4/34  bone]
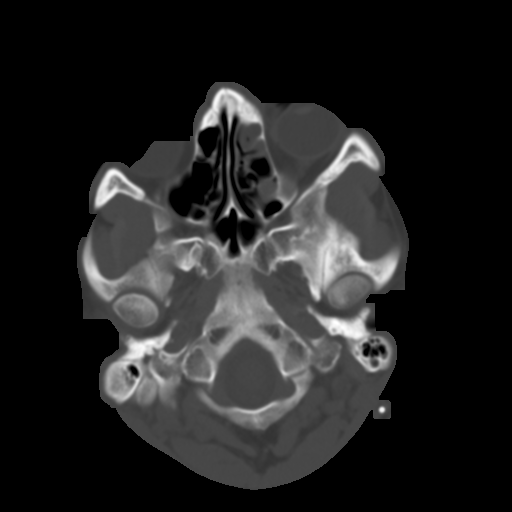
[im 7/34  brain]
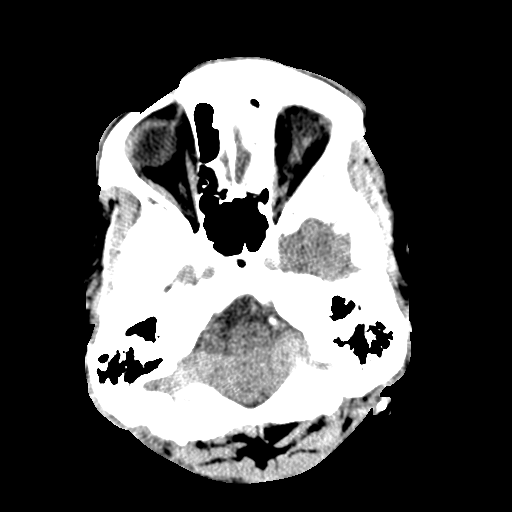
[im 10/34  brain]
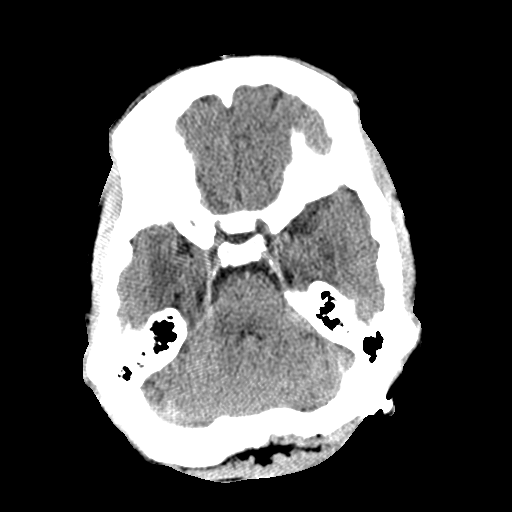
[im 14/34  brain]
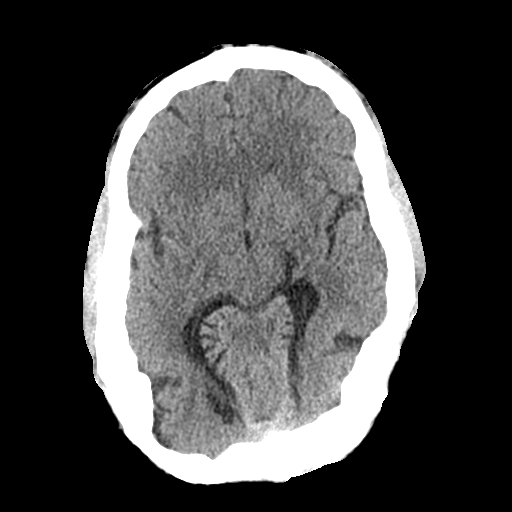
[im 17/34  brain]
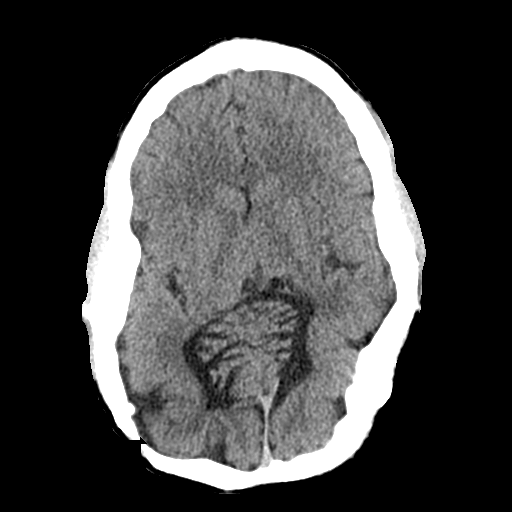
[im 17/34  bone]
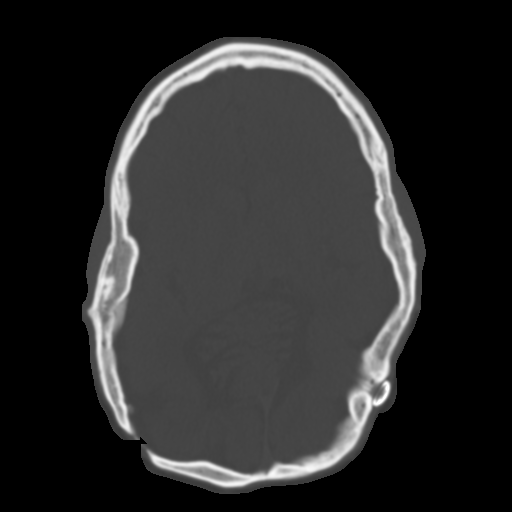
[im 20/34  brain]
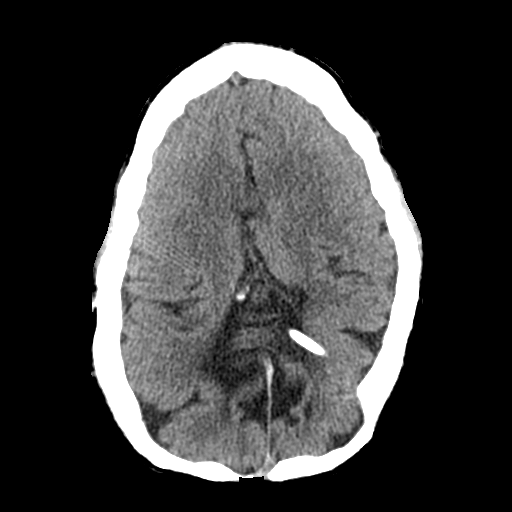
[im 24/34  brain]
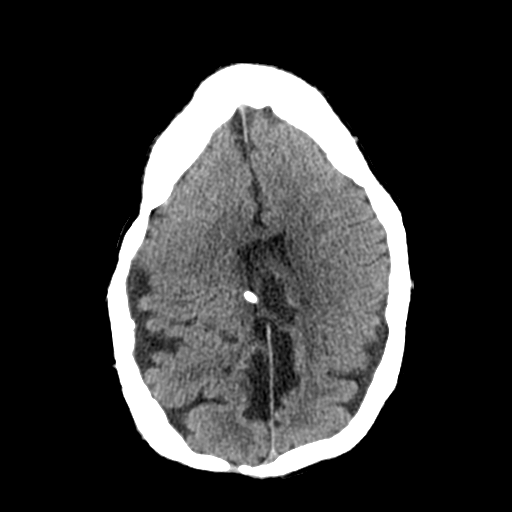
[im 27/34  brain]
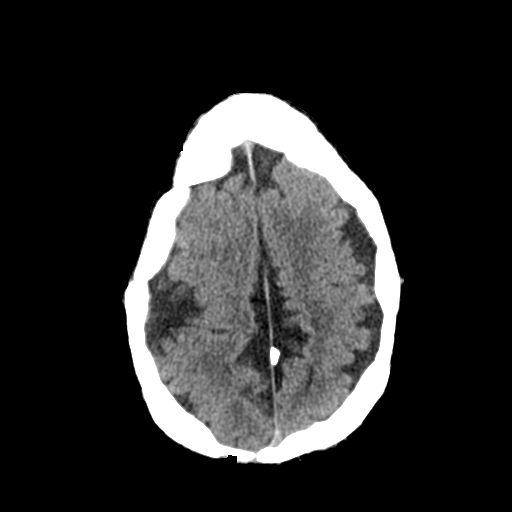
[im 30/34  brain]
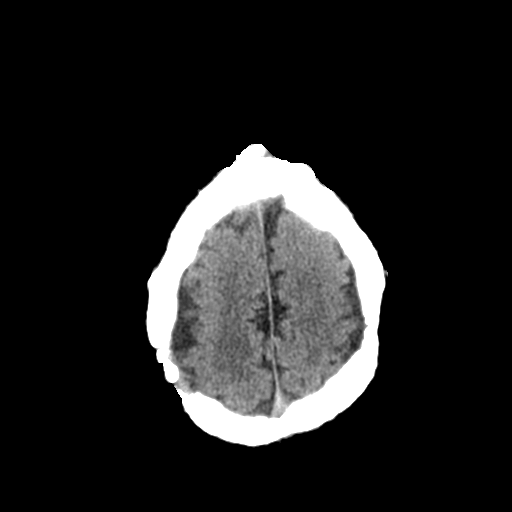
[im 30/34  bone]
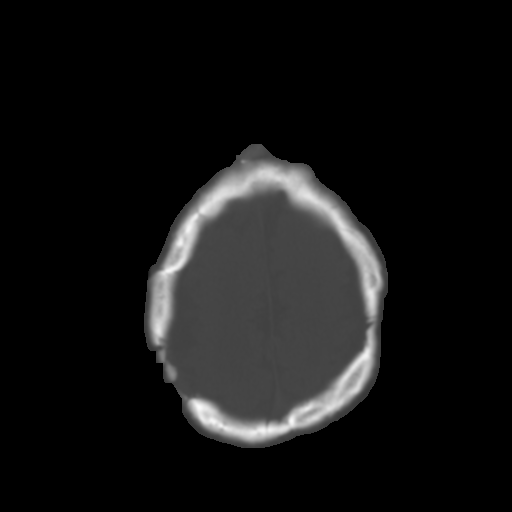

[Series 3: bone windows · axial · 0.42mm/px · z∈[-103,+8]mm · 7 of 57 slices shown]
[im 7/57  bone]
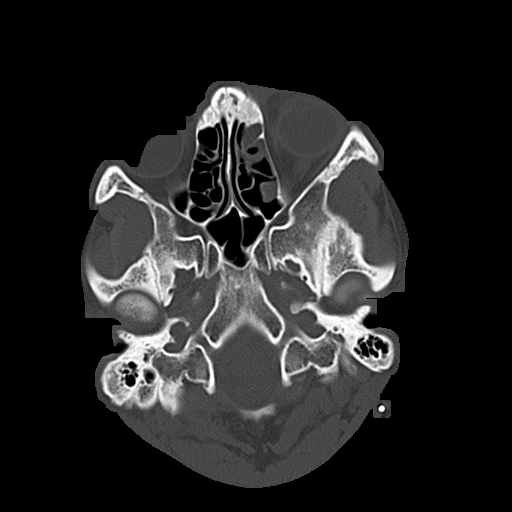
[im 13/57  bone]
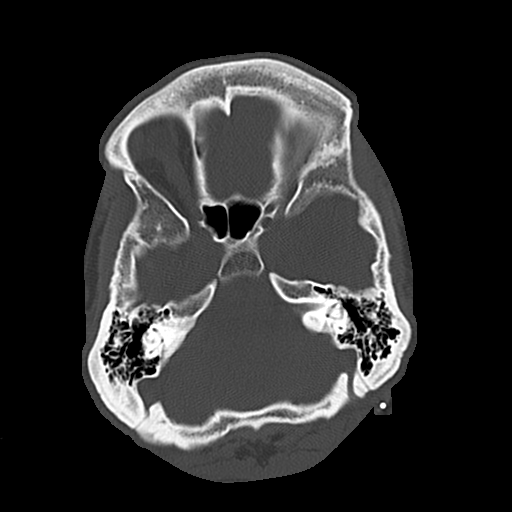
[im 19/57  bone]
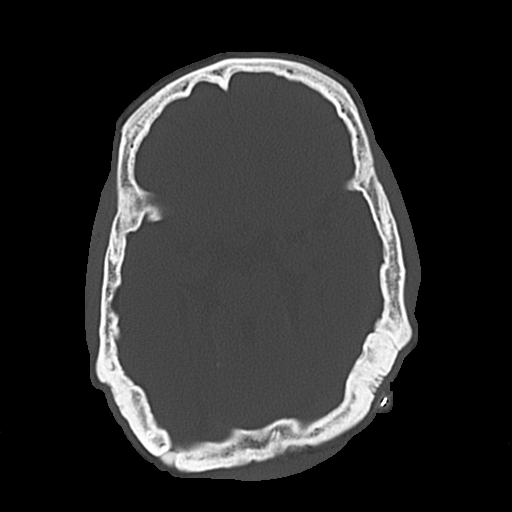
[im 25/57  bone]
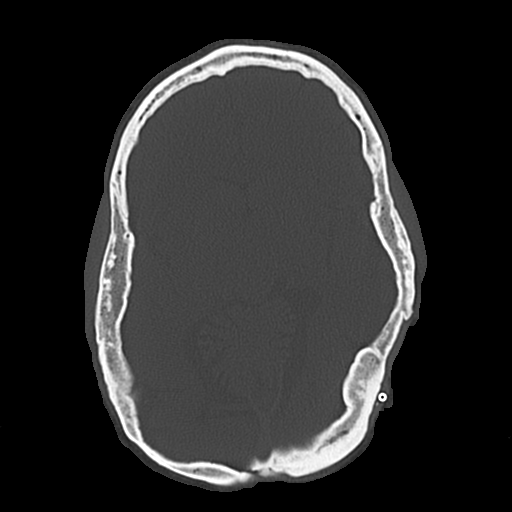
[im 32/57  bone]
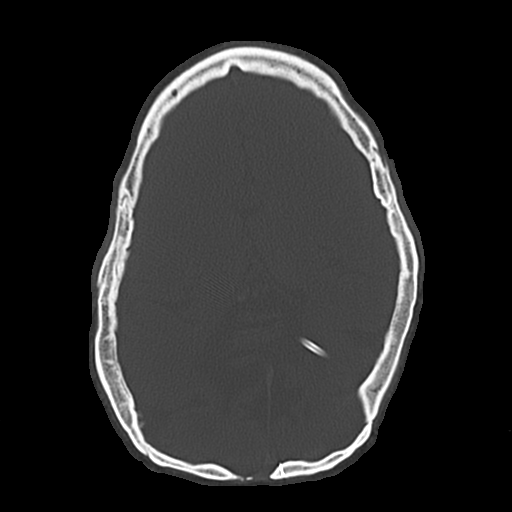
[im 38/57  bone]
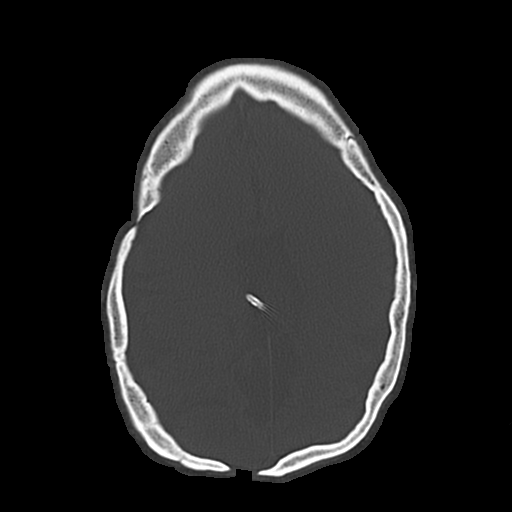
[im 44/57  bone]
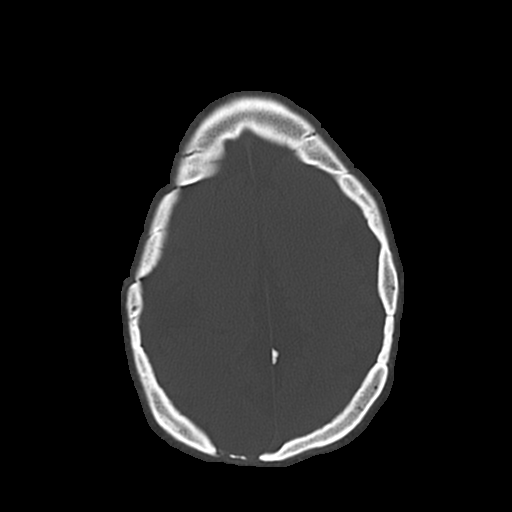

[16 of 30 positions shown; findings below may reference images not displayed]

FINDINGS: The congenital anomalies of the brain are stable and compatible with
previous description of Chiari 2 malformation and agenesis of the
corpus callosum. The left-sided ventriculostomy shunt catheter is
stable in position. Ventricular size and configuration is stable.

There is no mass, hemorrhage, edema, or other evidence of acute
parenchymal abnormality. No extra-axial hemorrhage. No skull
fracture identified. There is chronic opacification of the left
maxillary sinus and left ethmoid air cells.
IMPRESSION: 1. No evidence of acute intracranial abnormality. No intracranial
mass, hemorrhage, or edema.

2. Stable appearance of the previously described congenital
malformations. Ventricular shunt catheter is stable in position. No
hydrocephalus.

## 2020-08-06 DIAGNOSIS — F3132 Bipolar disorder, current episode depressed, moderate: Secondary | ICD-10-CM | POA: Insufficient documentation

## 2023-01-13 ENCOUNTER — Other Ambulatory Visit: Payer: Self-pay

## 2023-01-13 ENCOUNTER — Emergency Department (HOSPITAL_COMMUNITY)
Admission: EM | Admit: 2023-01-13 | Discharge: 2023-01-16 | Disposition: A | Discharge status: Home / Self Care | Payer: MEDICAID | Attending: Emergency Medicine | Admitting: Emergency Medicine

## 2023-01-13 DIAGNOSIS — F79 Unspecified intellectual disabilities: Secondary | ICD-10-CM | POA: Insufficient documentation

## 2023-01-13 DIAGNOSIS — R45851 Suicidal ideations: Secondary | ICD-10-CM

## 2023-01-13 DIAGNOSIS — Z91040 Latex allergy status: Secondary | ICD-10-CM | POA: Insufficient documentation

## 2023-01-13 LAB — CBC
HCT: 40.2 % (ref 39.0–52.0)
Hemoglobin: 13.4 g/dL (ref 13.0–17.0)
MCH: 27.7 pg (ref 26.0–34.0)
MCHC: 33.3 g/dL (ref 30.0–36.0)
MCV: 83.1 fL (ref 80.0–100.0)
Platelets: 202 10*3/uL (ref 150–400)
RBC: 4.84 MIL/uL (ref 4.22–5.81)
RDW: 13 % (ref 11.5–15.5)
WBC: 8.5 10*3/uL (ref 4.0–10.5)
nRBC: 0 % (ref 0.0–0.2)

## 2023-01-13 LAB — RAPID URINE DRUG SCREEN, HOSP PERFORMED
Amphetamines: NOT DETECTED
Barbiturates: NOT DETECTED
Benzodiazepines: NOT DETECTED
Cocaine: NOT DETECTED
Opiates: NOT DETECTED
Tetrahydrocannabinol: NOT DETECTED

## 2023-01-13 LAB — COMPREHENSIVE METABOLIC PANEL
ALT: 20 U/L (ref 0–44)
AST: 17 U/L (ref 15–41)
Albumin: 3.9 g/dL (ref 3.5–5.0)
Alkaline Phosphatase: 39 U/L (ref 38–126)
Anion gap: 9 (ref 5–15)
BUN: 14 mg/dL (ref 6–20)
CO2: 27 mmol/L (ref 22–32)
Calcium: 9.2 mg/dL (ref 8.9–10.3)
Chloride: 102 mmol/L (ref 98–111)
Creatinine, Ser: 0.83 mg/dL (ref 0.61–1.24)
GFR, Estimated: >60 mL/min (ref >60)
Glucose, Bld: 130 mg/dL — ABNORMAL HIGH (ref 70–99)
Potassium: 4.1 mmol/L (ref 3.5–5.1)
Sodium: 138 mmol/L (ref 135–145)
Total Bilirubin: 0.6 mg/dL (ref <1.2)
Total Protein: 7.5 g/dL (ref 6.5–8.1)

## 2023-01-13 LAB — ACETAMINOPHEN LEVEL: Acetaminophen (Tylenol), Serum: <10 ug/mL — ABNORMAL LOW (ref 10–30)

## 2023-01-13 LAB — SALICYLATE LEVEL: Salicylate Lvl: <7 mg/dL — ABNORMAL LOW (ref 7.0–30.0)

## 2023-01-13 LAB — ETHANOL: Alcohol, Ethyl (B): <10 mg/dL (ref <10)

## 2023-01-13 MED ORDER — LORAZEPAM 1 MG PO TABS
1.0000 mg | ORAL_TABLET | ORAL | Status: AC | PRN
Start: 2023-01-13 — End: 2023-01-14
  Administered 2023-01-14: 1 mg via ORAL
  Filled 2023-01-13: qty 1

## 2023-01-13 MED ORDER — IBUPROFEN 800 MG PO TABS
800.0000 mg | ORAL_TABLET | Freq: Once | ORAL | Status: AC
  Administered 2023-01-13: 800 mg via ORAL
  Filled 2023-01-13: qty 1

## 2023-01-13 MED ORDER — NICOTINE POLACRILEX 2 MG MT GUM
2.0000 mg | CHEWING_GUM | OROMUCOSAL | Status: DC | PRN
  Administered 2023-01-13 – 2023-01-15 (×9): 2 mg via ORAL
  Filled 2023-01-13 (×9): qty 1

## 2023-01-13 MED ORDER — ACETAMINOPHEN 325 MG PO TABS
650.0000 mg | ORAL_TABLET | ORAL | Status: DC | PRN
  Administered 2023-01-14 – 2023-01-15 (×3): 650 mg via ORAL
  Filled 2023-01-13 (×3): qty 2

## 2023-01-13 MED ORDER — ZIPRASIDONE MESYLATE 20 MG IM SOLR
20.0000 mg | Freq: Two times a day (BID) | INTRAMUSCULAR | Status: DC | PRN
  Administered 2023-01-14 – 2023-01-16 (×2): 20 mg via INTRAMUSCULAR
  Filled 2023-01-13 (×2): qty 20

## 2023-01-13 NOTE — ED Triage Notes (Signed)
Patient BIB EMS c/o SI without plan. Per report pt doesn't like his Group home.

## 2023-01-13 NOTE — ED Provider Notes (Signed)
[place] EMERGENCY DEPARTMENT AT [facility] Provider Note   CSN: [id number] Arrival date & time: 01/13/23  1858     History  Chief Complaint  Patient presents with   Suicidal    Dean Shepherd is a 28 y.o. male.  With a history of intellectual disability, aggressive behavior and SI who presents to the ED for suicidal ideation.  Patient resides in a group home  where he endorsed SI earlier today.  He had a plan to run out the middle of traffic.  No other attempted self-harm.  Denies ingestion, drug use and alcohol use.  No systemic complaints at this time.  HPI     Home Medications Prior to Admission medications   Medication Sig Start Date End Date Taking? Authorizing Provider  acetaminophen (TYLENOL) 325 MG tablet Take 650 mg by mouth every 6 (six) hours as needed for moderate pain or headache.    [provider]  amantadine (SYMMETREL) 100 MG capsule Take 1 capsule (100 mg total) by mouth 2 (two) times daily. 02/02/15   Earney Navy, NP  clonazePAM (KLONOPIN) 1 MG tablet Take 1 tablet (1 mg total) by mouth 2 (two) times daily. 02/02/15   Earney Navy, NP  divalproex (DEPAKOTE ER) 250 MG 24 hr tablet Take 3 tablets (750 mg total) by mouth at bedtime. 02/02/15   Earney Navy, NP  divalproex (DEPAKOTE ER) 500 MG 24 hr tablet Take 1 tablet (500 mg total) by mouth daily. 02/02/15   Earney Navy, NP  docusate sodium (COLACE) 100 MG capsule Take 100 mg by mouth 3 (three) times daily.    [provider]  FLUoxetine (PROZAC) 40 MG capsule Take 1 capsule (40 mg total) by mouth daily. 02/02/15   Earney Navy, NP  lamoTRIgine (LAMICTAL) 100 MG tablet Take 1 tablet (100 mg total) by mouth every morning. 02/02/15   Earney Navy, NP  OLANZapine (ZYPREXA) 15 MG tablet Take 1 tablet (15 mg total) by mouth 2 (two) times daily after a meal. 02/02/15   Earney Navy, NP  propranolol (INDERAL) 20 MG tablet Take 1 tablet (20 mg  total) by mouth 2 (two) times daily. 02/02/15   Earney Navy, NP      Allergies    Latex    Review of Systems   Review of Systems  Physical Exam Updated Vital Signs BP 108/85 (BP Location: Right Arm)   Pulse (!) 110   Temp 99.2 F (37.3 C) (Oral)   Resp 18   SpO2 96%  Physical Exam Vitals and nursing note reviewed.  HENT:     Head: Normocephalic and atraumatic.  Eyes:     Pupils: Pupils are equal, round, and reactive to light.  Cardiovascular:     Rate and Rhythm: Normal rate and regular rhythm.  Pulmonary:     Effort: Pulmonary effort is normal.     Breath sounds: Normal breath sounds.  Abdominal:     Palpations: Abdomen is soft.     Tenderness: There is no abdominal tenderness.  Skin:    General: Skin is warm and dry.  Neurological:     Mental Status: He is alert.  Psychiatric:        Mood and Affect: Mood normal.     ED Results / Procedures / Treatments   Labs (all labs ordered are listed, but only abnormal results are displayed) Labs Reviewed  COMPREHENSIVE METABOLIC PANEL - Abnormal; Notable for the following components:  Result Value   Glucose, Bld 130 (*)    All other components within normal limits  SALICYLATE LEVEL - Abnormal; Notable for the following components:   Salicylate Lvl <7.0 (*)    All other components within normal limits  ACETAMINOPHEN LEVEL - Abnormal; Notable for the following components:   Acetaminophen (Tylenol), Serum <10 (*)    All other components within normal limits  ETHANOL  CBC  RAPID URINE DRUG SCREEN, HOSP PERFORMED    EKG None  Radiology No results found.  Procedures Procedures    Medications Ordered in ED Medications - No data to display  ED Course/ Medical Decision Making/ A&P Clinical Course as of 01/13/23 2315  Tue Jan 13, 2023  2315 Medically cleared for TTS evaluation.  Medications not ordered as we do not have an updated med list at this time [MP]    Clinical Course User Index [MP]  Royanne Foots, DO                                 Medical Decision Making 27 year old male with history as above including history of suicidal ideation presents from group home for suicidal ideation.  Vague SI with plan to run out the middle of traffic.  Comes here via EMS.  Is calm and cooperative on my assessment but continues to endorse SI.  No other attempted self-harm or ingestions.  Medically cleared for TTS evaluation  Amount and/or Complexity of Data Reviewed Labs: ordered.           Final Clinical Impression(s) / ED Diagnoses Final diagnoses:  Suicidal ideation  Intellectual disability    Rx / DC Orders ED Discharge Orders     None         Royanne Foots, DO 01/13/23 2315

## 2023-01-13 NOTE — ED Notes (Signed)
Patient reports wanting to kill himself with a gun, but does not currently have access to a gun.

## 2023-01-13 NOTE — ED Triage Notes (Signed)
Patient report he got thoughts on shooting himself or cutting himself with a knife.

## 2023-01-13 NOTE — ED Notes (Signed)
2 personal belonging bags. Located in cabinet 23-25 H[facility].

## 2023-01-13 NOTE — ED Notes (Signed)
One bag of patient belongings was placed at the 23-25 nurses station.

## 2023-01-13 NOTE — ED Notes (Signed)
Pt has been changed into burgundy scrubs.   

## 2023-01-14 ENCOUNTER — Encounter (HOSPITAL_COMMUNITY): Payer: Self-pay

## 2023-01-14 DIAGNOSIS — F79 Unspecified intellectual disabilities: Secondary | ICD-10-CM

## 2023-01-14 MED ORDER — MELATONIN 3 MG PO TABS
3.0000 mg | ORAL_TABLET | Freq: Every day | ORAL | Status: DC
  Administered 2023-01-14 – 2023-01-15 (×3): 3 mg via ORAL
  Filled 2023-01-14 (×3): qty 1

## 2023-01-14 MED ORDER — ONDANSETRON 4 MG PO TBDP
4.0000 mg | ORAL_TABLET | Freq: Three times a day (TID) | ORAL | Status: DC | PRN
  Administered 2023-01-14: 4 mg via ORAL
  Filled 2023-01-14: qty 1

## 2023-01-14 MED ORDER — LIDOCAINE 5 % EX PTCH
1.0000 | MEDICATED_PATCH | CUTANEOUS | Status: DC
Start: 2023-01-14 — End: 2023-01-17
  Administered 2023-01-14 – 2023-01-16 (×3): 1 via TRANSDERMAL
  Filled 2023-01-14 (×3): qty 1

## 2023-01-14 MED ORDER — IBUPROFEN 200 MG PO TABS
400.0000 mg | ORAL_TABLET | Freq: Four times a day (QID) | ORAL | Status: DC | PRN
  Administered 2023-01-14 – 2023-01-15 (×2): 400 mg via ORAL
  Filled 2023-01-14 (×2): qty 2

## 2023-01-14 MED ORDER — STERILE WATER FOR INJECTION IJ SOLN
INTRAMUSCULAR | Status: AC
  Administered 2023-01-14: 10 mL
  Filled 2023-01-14: qty 10

## 2023-01-14 NOTE — ED Notes (Signed)
Medication given due to pt increasing agitation from being in the hallway. Pt says "I need some medicine because I'm afraid I might start hitting things"

## 2023-01-14 NOTE — ED Notes (Signed)
Hall C refused lunch and dinner tray, Pt given [place] sandwich and cola

## 2023-01-14 NOTE — ED Notes (Signed)
Pt removed lidocaine patch and threw it away, says "It itches too much"

## 2023-01-14 NOTE — ED Provider Notes (Signed)
Emergency Medicine Observation Re-evaluation Note  Dean Shepherd is a 27 y.o. male, seen on rounds today.  Pt initially presented to the ED for complaints of Suicidal Currently, the patient is awake sitting up in stretcher, no acute complaints.  Physical Exam  BP 131/85 (BP Location: Left Arm)   Pulse 92   Temp 98.8 F (37.1 C) (Oral)   Resp 16   SpO2 99%  Physical Exam General: Awake and alert, no acute distress Cardiac: Regular rate Lungs: No increased work of breathing Psych: Calm, cooperative  ED Course / MDM  EKG:   I have reviewed the labs performed to date as well as medications administered while in observation.  Recent changes in the last 24 hours include patient medically cleared, recommended for inpatient psych, pending placement.  Plan  Current plan is for inpatient psych.    Rexford Maus, DO 01/14/23 [id number]

## 2023-01-14 NOTE — Progress Notes (Signed)
LCSW Progress Note  [id number]   Dean Shepherd  01/14/2023  2:29 PM  Description:   Inpatient Psychiatric Referral  Patient was recommended inpatient per: Phebe Colla NP There are no available beds at [facility], per [facility] Rona Ravens Patient was referred to the following out of   network facilities: Service Provider Address Phone Fax  [facility] Health Patient Placement  [facility] [place], [place] [phone]  [facility]  [address] [place] [address] [phone]  [facility]  [address], [place] [address] [phone]  [place]  [address] [place] [address] [phone]  [facility] [address]  [address] [place] [address] [phone]  [facility]  [address], [place] [address] [phone]  [facility]  [address] [place], [place] [address] [phone]  [facility]  [address], [place] [address] [phone]  [facility] EFAX  [address] [place], [place] [phone]  [facility] Inpatient [place]  [place] [phone]  [facility]D [facility]  [facility] [place] [phone]  [facility]  [place] [phone]  [facility] [address]  [place] [address] [phone]  [facility]  [facility] Regino Bellow [place] [address] [phone]       Situation ongoing, CSW to continue following and update chart as more information becomes available.      [place] Dean Johnstone LCSW-A   01/14/2023 2:29 PM

## 2023-01-14 NOTE — Progress Notes (Signed)
[facility] Psych ED Progress Note  01/14/2023 4:21 PM Dean Shepherd  MRN:  [id number]   Subjective:  Dean Shepherd, 27 y.o., male patient seen face to face by this provider, consulted with Dr. Lucianne Muss; and chart reviewed on 01/14/23.  On evaluation Dean Shepherd reports he is not doing good. He says his neck and back hurt from being on a stretcher in the hall.  Patient is adamant that he wants to kill himself.  Patient states "I want to run into traffic to get hit and killed". When asked how long he has felt this way, patient states he has been suicidal for 3 months.  He says there was an issue at his group home and he wants to die.  During evaluation Dean Shepherd is seated in no acute distress.  He is alert, oriented x 4, calm, cooperative and attentive.  His mood is anxious with congruent affect.  He has normal speech, and behavior.  Objectively there is no evidence of psychosis/mania or delusional thinking.  Patient is able to converse coherently, with goal directed thoughts.  He denies homicidal ideation, psychosis, and paranoia.  Patient strongly endorses suicidal ideation.  Patient answered questions appropriately.    Patient is a danger to himself and requires inpatient psychiatric hospitalization for stabilization and treatment.     Principal Problem: Intellectual disability Diagnosis:  Principal Problem:   Intellectual disability Active Problems:   Suicidal ideations   ED Assessment Time Calculation: Start Time: 1500 Stop Time: 1600 Total Time in Minutes (Assessment Completion): 60   Past Psychiatric History: see consult  [place] Scale:  Flowsheet Row ED from 01/13/2023 in [facility] Emergency Department at [facility]  C-SSRS RISK CATEGORY High Risk       Past Medical History:  Past Medical History:  Diagnosis Date   ADHD (attention deficit hyperactivity disorder)    Hydrocephalus (HCC)    Hypertension    Mental retardation    Oppositional defiant behavior     Past Surgical  History:  Procedure Laterality Date   CSF SHUNT     ELBOW SURGERY     Family History:  Family History  Family history unknown: Yes   Family Psychiatric  History: see consult Social History:  Social History   Substance and Sexual Activity  Alcohol Use No     Social History   Substance and Sexual Activity  Drug Use No    Social History   Socioeconomic History   Marital status: Single    Spouse name: Not on file   Number of children: Not on file   Years of education: Not on file   Highest education level: Not on file  Occupational History   Not on file  Tobacco Use   Smoking status: Never   Smokeless tobacco: Never  Substance and Sexual Activity   Alcohol use: No   Drug use: No   Sexual activity: Not on file  Other Topics Concern   Not on file  Social History Narrative   Not on file   Social Determinants of Health   Financial Resource Strain: Not on file  Food Insecurity: Not on file  Transportation Needs: Not on file  Physical Activity: Not on file  Stress: Not on file  Social Connections: Not on file    Sleep: Fair  Appetite:  Good  Current Medications: Current Facility-Administered Medications  Medication Dose Route Frequency Provider Last Rate Last Admin   acetaminophen (TYLENOL) tablet 650 mg  650 mg Oral Q4H PRN Elayne Snare,  Michael A, DO   650 mg at 01/14/23 1041   ibuprofen (ADVIL) tablet 400 mg  400 mg Oral Q6H PRN Elayne Snare K, DO   400 mg at 01/14/23 1205   lidocaine (LIDODERM) 5 % 1 patch  1 patch Transdermal Q24H Elayne Snare K, DO   1 patch at 01/14/23 1203   nicotine polacrilex (NICORETTE) gum 2 mg  2 mg Oral PRN Estelle June A, DO   2 mg at 01/14/23 1505   ondansetron (ZOFRAN-ODT) disintegrating tablet 4 mg  4 mg Oral Q8H PRN Elayne Snare K, DO   4 mg at 01/14/23 1339   ziprasidone (GEODON) injection 20 mg  20 mg Intramuscular Q12H PRN Royanne Foots, DO       Current Outpatient Medications  Medication Sig Dispense  Refill   amantadine (SYMMETREL) 100 MG capsule Take 1 capsule (100 mg total) by mouth 2 (two) times daily. (Patient taking differently: Take 100 mg by mouth in the morning.) 60 capsule 0   divalproex (DEPAKOTE ER) 500 MG 24 hr tablet Take 1 tablet (500 mg total) by mouth daily. (Patient taking differently: Take 500 mg by mouth in the morning and at bedtime.) 30 tablet 0   FLUoxetine (PROZAC) 20 MG capsule Take 20 mg by mouth daily.     hydrOXYzine (ATARAX) 25 MG tablet Take 25 mg by mouth 3 (three) times daily as needed for anxiety.     lamoTRIgine (LAMICTAL) 100 MG tablet Take 1 tablet (100 mg total) by mouth every morning. (Patient taking differently: Take 100 mg by mouth 2 (two) times daily.) 30 tablet 0   OLANZapine (ZYPREXA) 20 MG tablet Take 20 mg by mouth at bedtime.     OLANZapine (ZYPREXA) 5 MG tablet Take 5 mg by mouth as needed (agitation or delusions).     polyethylene glycol (MIRALAX / GLYCOLAX) 17 g packet Take 17 g by mouth daily.     clonazePAM (KLONOPIN) 1 MG tablet Take 1 tablet (1 mg total) by mouth 2 (two) times daily. (Patient not taking: Reported on 01/14/2023) 60 tablet 0   FLUoxetine (PROZAC) 40 MG capsule Take 1 capsule (40 mg total) by mouth daily. (Patient not taking: Reported on 01/14/2023) 30 capsule 0   propranolol (INDERAL) 20 MG tablet Take 1 tablet (20 mg total) by mouth 2 (two) times daily. (Patient not taking: Reported on 01/14/2023) 60 tablet 0    Lab Results:  Results for orders placed or performed during the hospital encounter of 01/13/23 (from the past 48 hour(s))  Rapid urine drug screen (hospital performed)     Status: None   Collection Time: 01/13/23  7:24 PM  Result Value Ref Range   Opiates NONE DETECTED NONE DETECTED   Cocaine NONE DETECTED NONE DETECTED   Benzodiazepines NONE DETECTED NONE DETECTED   Amphetamines NONE DETECTED NONE DETECTED   Tetrahydrocannabinol NONE DETECTED NONE DETECTED   Barbiturates NONE DETECTED NONE DETECTED    Comment:  (NOTE) DRUG SCREEN FOR MEDICAL PURPOSES ONLY.  IF CONFIRMATION IS NEEDED FOR ANY PURPOSE, NOTIFY LAB WITHIN 5 DAYS.  LOWEST DETECTABLE LIMITS FOR URINE DRUG SCREEN Drug Class                     Cutoff (ng/mL) Amphetamine and metabolites    1000 Barbiturate and metabolites    200 Benzodiazepine                 200 Opiates and metabolites  300 Cocaine and metabolites        300 THC                            50 Performed at [facility], [address], [place], [place] [address]   Ethanol     Status: None   Collection Time: 01/13/23  7:31 PM  Result Value Ref Range   Alcohol, Ethyl (B) <10 <10 mg/dL    Comment: (NOTE) Lowest detectable limit for serum alcohol is 10 mg/dL.  For medical purposes only. Performed at [facility], [address], [place], [place] [address]   Salicylate level     Status: Abnormal   Collection Time: 01/13/23  7:31 PM  Result Value Ref Range   Salicylate Lvl <7.0 (L) 7.0 - 30.0 mg/dL    Comment: Performed at [facility], [address], [place], [place] [address]  Acetaminophen level     Status: Abnormal   Collection Time: 01/13/23  7:31 PM  Result Value Ref Range   Acetaminophen (Tylenol), Serum <10 (L) 10 - 30 ug/mL    Comment: (NOTE) Therapeutic concentrations vary significantly. A range of 10-30 ug/mL  may be an effective concentration for many patients. However, some  are best treated at concentrations outside of this range. Acetaminophen concentrations >150 ug/mL at 4 hours after ingestion  and >50 ug/mL at 12 hours after ingestion are often associated with  toxic reactions.  Performed at [facility], [address] [place], [place] [address]   Comprehensive metabolic panel     Status: Abnormal   Collection Time: 01/13/23  7:39 PM  Result Value Ref Range   Sodium 138 135 - 145 mmol/L   Potassium 4.1 3.5 - 5.1 mmol/L   Chloride 102 98 - 111 mmol/L    CO2 27 22 - 32 mmol/L   Glucose, Bld 130 (H) 70 - 99 mg/dL    Comment: Glucose reference range applies only to samples taken after fasting for at least 8 hours.   BUN 14 6 - 20 mg/dL   Creatinine, Ser 6.57 0.61 - 1.24 mg/dL   Calcium 9.2 8.9 - 84.6 mg/dL   Total Protein 7.5 6.5 - 8.1 g/dL   Albumin 3.9 3.5 - 5.0 g/dL   AST 17 15 - 41 U/L   ALT 20 0 - 44 U/L   Alkaline Phosphatase 39 38 - 126 U/L   Total Bilirubin 0.6 <1.2 mg/dL   GFR, Estimated >96 >29 mL/min    Comment: (NOTE) Calculated using the CKD-EPI Creatinine Equation (2021)    Anion gap 9 5 - 15    Comment: Performed at [facility], [address], [place], [place] [address]  cbc     Status: None   Collection Time: 01/13/23  7:39 PM  Result Value Ref Range   WBC 8.5 4.0 - 10.5 K/uL   RBC 4.84 4.22 - 5.81 MIL/uL   Hemoglobin 13.4 13.0 - 17.0 g/dL   HCT 32.4 40.1 - 02.7 %   MCV 83.1 80.0 - 100.0 fL   MCH 27.7 26.0 - 34.0 pg   MCHC 33.3 30.0 - 36.0 g/dL   RDW 25.3 66.4 - 40.3 %   Platelets 202 150 - 400 K/uL   nRBC 0.0 0.0 - 0.2 %    Comment: Performed at [facility], [address] [place], [place] [address]    Blood  Alcohol level:  Lab Results  Component Value Date   ETH <10 01/13/2023   ETH <5 01/23/2015    Musculoskeletal: Strength & Muscle Tone: within normal limits Gait & Station: normal Patient leans: N/A  Psychiatric Specialty Exam:  Presentation  General Appearance:  Appropriate for Environment  Eye Contact: Good  Speech: Clear and Coherent; Normal Rate  Speech Volume: Normal  Handedness: Right   Mood and Affect  Mood: Anxious  Affect: Congruent   Thought Process  Thought Processes: Linear  Descriptions of Associations:Intact  Orientation:Full (Time, Place and Person)  Thought Content:WDL  History of Schizophrenia/Schizoaffective disorder:No data recorded Duration of Psychotic Symptoms:No data  recorded Hallucinations:Hallucinations: None  Ideas of Reference:None  Suicidal Thoughts:Suicidal Thoughts: Yes, Active SI Active Intent and/or Plan: With Intent; With Plan  Homicidal Thoughts:Homicidal Thoughts: No   Sensorium  Memory: Immediate Fair; Recent Fair; Remote Fair  Judgment: Impaired  Insight: Poor   Executive Functions  Concentration: Fair  Attention Span: Fair  Recall: [employer] of Knowledge: Fair  Language: Fair   Psychomotor Activity  Psychomotor Activity: Psychomotor Activity: Normal   Assets  Assets: Social Support; Leisure Time   Sleep  Sleep: Sleep: Fair Number of Hours of Sleep: 6    Physical Exam: Physical Exam Vitals and nursing note reviewed.  Eyes:     Pupils: Pupils are equal, round, and reactive to light.  Pulmonary:     Effort: Pulmonary effort is normal.  Skin:    General: Skin is dry.  Neurological:     Mental Status: He is alert and oriented to person, place, and time.    Review of Systems  Psychiatric/Behavioral:  Positive for suicidal ideas.   All other systems reviewed and are negative.  Blood pressure (!) 141/79, pulse 76, temperature (!) 97.5 F (36.4 C), temperature source Oral, resp. rate 17, SpO2 100%. There is no height or weight on file to calculate BMI.   Medical Decision Making: Patient case reviewed and discussed with Dr. Lucianne Muss. Patient is a danger to himself and requires inpatient psychiatric hospitalization for stabilization and treatment.  Problem 1: Suicidal ideation -Recommend inpatient psychiatric hospitalization for stabilization and treatment.   Thomes Lolling, NP 01/14/2023, 4:21 PM

## 2023-01-14 NOTE — ED Notes (Signed)
Dean Shepherd ( pt legal guardian/mom)  pt mom request pt not call 28 year old grandfather. states grandpa has medical issues and is going through a lot and doesn't need any stress from [facility].  [facility] number is [phone].    Mom # [phone]

## 2023-01-14 NOTE — ED Notes (Signed)
Pt has one belongings bag placed in locker 35

## 2023-01-14 NOTE — ED Notes (Signed)
Pt requesting medication for "irritation" - MD notified

## 2023-01-14 NOTE — Progress Notes (Signed)
[facility] Consult Note  Patient Name: Dean Shepherd MRN: [id number] DOB: October 05, 1995 DATE OF Consult: 01/14/2023  PRIMARY PSYCHIATRIC DIAGNOSES  1.    Intellectual disability 2.   intermittent explosive disorder 3.   suicidal ideation  RECOMMENDATIONS  Recommendations: Medication recommendations:  continue patient's current medications as prescribed.  Please obtain updated list from group home. Is inpatient psychiatric hospitalization recommended for this patient? Yes (Explain why):   Patient has clear suicidal plan and intent.  He says he would jump out of the window and run into traffic to kill himself. Follow-Up Telepsychiatry C/L services: We will sign off for now. Please re-consult our service if needed for any concerning changes in the patient's condition, discharge planning, or questions.  Thank you for involving [place] in the care of this patient. If you have any additional questions or concerns, please call [phone] and ask for me or the provider on-call.  TELEPSYCHIATRY ATTESTATION & CONSENT  As the provider for this telehealth consult, I attest that I verified the patient's identity using two separate identifiers, introduced myself to the patient, provided my credentials, disclosed my location, and performed this encounter via a HIPAA-compliant, real-time, face-to-face, two-way, interactive audio and video platform and with the full consent and agreement of the patient (or guardian as applicable.)  Patient physical location: [facility] Emergency Department at [facility] . Telehealth provider physical location: home office in state of [place].  Video start time: 4:10 am  (Central Time) Video end time: 4:35 am (Central Time)  IDENTIFYING DATA  Dean Shepherd is a 27 y.o. year-old male for whom a psychiatric consultation has been ordered by the primary provider. The patient was identified using two separate identifiers.  CHIEF COMPLAINT/REASON FOR CONSULT    Suicidal  ideation  HISTORY OF PRESENT ILLNESS (HPI)  The patient  is a 27 year old male with intellectual disability who resides in a group home.  He presents to the emergency department with suicidal ideation.  He tells me that he does not like his current group home and will kill himself.  He says he has been with this company for over 5 years but they just moved him to this new group home a couple of months ago.  They have been talking about discharging him.  He is ambivalent saying he wants to be discharged and then requests to be put in an institution because he cannot survive out in the real world.  He says he has a care coordinator but does not know her number.  He then tells me that if he is discharged from the hospital he will jump out of the window and run into traffic. Last time he was inpatient was a couple of years ago.  He denies visual or auditory hallucinations.[name]  PAST PSYCHIATRIC HISTORY   Otherwise as per HPI above.  PAST MEDICAL HISTORY  Past Medical History:  Diagnosis Date   ADHD (attention deficit hyperactivity disorder)    Hydrocephalus (HCC)    Hypertension    Mental retardation    Oppositional defiant behavior      HOME MEDICATIONS  Facility Ordered Medications  Medication   acetaminophen (TYLENOL) tablet 650 mg   ziprasidone (GEODON) injection 20 mg   And   LORazepam (ATIVAN) tablet 1 mg   nicotine polacrilex (NICORETTE) gum 2 mg   [COMPLETED] ibuprofen (ADVIL) tablet 800 mg   PTA Medications  Medication Sig   docusate sodium (COLACE) 100 MG capsule Take 100 mg by mouth 3 (three) times daily.  acetaminophen (TYLENOL) 325 MG tablet Take 650 mg by mouth every 6 (six) hours as needed for moderate pain or headache.   amantadine (SYMMETREL) 100 MG capsule Take 1 capsule (100 mg total) by mouth 2 (two) times daily.   clonazePAM (KLONOPIN) 1 MG tablet Take 1 tablet (1 mg total) by mouth 2 (two) times daily.   divalproex (DEPAKOTE ER) 250 MG 24 hr tablet Take 3 tablets (750  mg total) by mouth at bedtime.   divalproex (DEPAKOTE ER) 500 MG 24 hr tablet Take 1 tablet (500 mg total) by mouth daily.   FLUoxetine (PROZAC) 40 MG capsule Take 1 capsule (40 mg total) by mouth daily.   lamoTRIgine (LAMICTAL) 100 MG tablet Take 1 tablet (100 mg total) by mouth every morning.   OLANZapine (ZYPREXA) 15 MG tablet Take 1 tablet (15 mg total) by mouth 2 (two) times daily after a meal.   propranolol (INDERAL) 20 MG tablet Take 1 tablet (20 mg total) by mouth 2 (two) times daily.     ALLERGIES  Allergies  Allergen Reactions   Latex Swelling    SOCIAL & SUBSTANCE USE HISTORY  Social History   Socioeconomic History   Marital status: Single    Spouse name: Not on file   Number of children: Not on file   Years of education: Not on file   Highest education level: Not on file  Occupational History   Not on file  Tobacco Use   Smoking status: Never   Smokeless tobacco: Never  Substance and Sexual Activity   Alcohol use: No   Drug use: No   Sexual activity: Not on file  Other Topics Concern   Not on file  Social History Narrative   Not on file   Social Determinants of Health   Financial Resource Strain: Not on file  Food Insecurity: Not on file  Transportation Needs: Not on file  Physical Activity: Not on file  Stress: Not on file  Social Connections: Not on file   Social History   Tobacco Use  Smoking Status Never  Smokeless Tobacco Never   Social History   Substance and Sexual Activity  Alcohol Use No   Social History   Substance and Sexual Activity  Drug Use No    Additional pertinent information .  FAMILY HISTORY  Family History  Family history unknown: Yes   Family Psychiatric History (if known):    MENTAL STATUS EXAM (MSE)  Presentation  General Appearance:  Appropriate for Environment  Eye Contact: Good  Speech: Normal Rate  Speech Volume: Normal  Handedness:No data recorded  Mood and Affect   Mood: Anxious  Affect: Congruent   Thought Process  Thought Processes: Linear  Descriptions of Associations: Intact  Orientation: Full (Time, Place and Person)  Thought Content: Logical  History of Schizophrenia/Schizoaffective disorder:No data recorded Duration of Psychotic Symptoms:No data recorded Hallucinations: Hallucinations: None  Ideas of Reference: None  Suicidal Thoughts: Suicidal Thoughts: Yes, Active SI Active Intent and/or Plan: With Intent; With Plan  Homicidal Thoughts: Homicidal Thoughts: No   Sensorium  Memory: Immediate Fair; Recent Fair  Judgment: Poor  Insight: Poor   Executive Functions  Concentration: Fair  Attention Span: Poor  Recall: Poor  Fund of Knowledge: Poor  Language: Poor   Psychomotor Activity  Psychomotor Activity: Psychomotor Activity: Normal   Assets  Assets: Social Support   Sleep  Sleep: Sleep: Fair   VITALS  Blood pressure 131/85, pulse 92, temperature 98.8 F (37.1 C), temperature source Oral,  resp. rate 16, SpO2 99%.  LABS  Admission on 01/13/2023  Component Date Value Ref Range Status   Sodium 01/13/2023 138  135 - 145 mmol/L Final   Potassium 01/13/2023 4.1  3.5 - 5.1 mmol/L Final   Chloride 01/13/2023 102  98 - 111 mmol/L Final   CO2 01/13/2023 27  22 - 32 mmol/L Final   Glucose, Bld 01/13/2023 130 (H)  70 - 99 mg/dL Final   Glucose reference range applies only to samples taken after fasting for at least 8 hours.   BUN 01/13/2023 14  6 - 20 mg/dL Final   Creatinine, Ser 01/13/2023 0.83  0.61 - 1.24 mg/dL Final   Calcium 04/54/0981 9.2  8.9 - 10.3 mg/dL Final   Total Protein 19/14/7829 7.5  6.5 - 8.1 g/dL Final   Albumin 56/21/3086 3.9  3.5 - 5.0 g/dL Final   AST 57/84/6962 17  15 - 41 U/L Final   ALT 01/13/2023 20  0 - 44 U/L Final   Alkaline Phosphatase 01/13/2023 39  38 - 126 U/L Final   Total Bilirubin 01/13/2023 0.6  <1.2 mg/dL Final   GFR, Estimated 01/13/2023 >60  >60  mL/min Final   Comment: (NOTE) Calculated using the CKD-EPI Creatinine Equation (2021)    Anion gap 01/13/2023 9  5 - 15 Final   Performed at [facility], [address], [place], [place] [address]   Alcohol, Ethyl (B) 01/13/2023 <10  <10 mg/dL Final   Comment: (NOTE) Lowest detectable limit for serum alcohol is 10 mg/dL.  For medical purposes only. Performed at [facility], [address] [place], [place] [address]    Salicylate Lvl 01/13/2023 <7.0 (L)  7.0 - 30.0 mg/dL Final   Performed at [facility], [address], [place], [place] [address]   Acetaminophen (Tylenol), Serum 01/13/2023 <10 (L)  10 - 30 ug/mL Final   Comment: (NOTE) Therapeutic concentrations vary significantly. A range of 10-30 ug/mL  may be an effective concentration for many patients. However, some  are best treated at concentrations outside of this range. Acetaminophen concentrations >150 ug/mL at 4 hours after ingestion  and >50 ug/mL at 12 hours after ingestion are often associated with  toxic reactions.  Performed at [facility], [address], [place], [place] [address]    WBC 01/13/2023 8.5  4.0 - 10.5 K/uL Final   RBC 01/13/2023 4.84  4.22 - 5.81 MIL/uL Final   Hemoglobin 01/13/2023 13.4  13.0 - 17.0 g/dL Final   HCT 44/05/4740 40.2  39.0 - 52.0 % Final   MCV 01/13/2023 83.1  80.0 - 100.0 fL Final   MCH 01/13/2023 27.7  26.0 - 34.0 pg Final   MCHC 01/13/2023 33.3  30.0 - 36.0 g/dL Final   RDW 59/56/3875 13.0  11.5 - 15.5 % Final   Platelets 01/13/2023 202  150 - 400 K/uL Final   nRBC 01/13/2023 0.0  0.0 - 0.2 % Final   Performed at [facility], [address], [place], [place] [address]   Opiates 01/13/2023 NONE DETECTED  NONE DETECTED Final   Cocaine 01/13/2023 NONE DETECTED  NONE DETECTED Final   Benzodiazepines 01/13/2023 NONE DETECTED  NONE DETECTED Final   Amphetamines 01/13/2023 NONE  DETECTED  NONE DETECTED Final   Tetrahydrocannabinol 01/13/2023 NONE DETECTED  NONE DETECTED Final   Barbiturates 01/13/2023 NONE DETECTED  NONE DETECTED Final   Comment: (NOTE) DRUG SCREEN FOR MEDICAL PURPOSES ONLY.  IF CONFIRMATION  IS NEEDED FOR ANY PURPOSE, NOTIFY LAB WITHIN 5 DAYS.  LOWEST DETECTABLE LIMITS FOR URINE DRUG SCREEN Drug Class                     Cutoff (ng/mL) Amphetamine and metabolites    1000 Barbiturate and metabolites    200 Benzodiazepine                 200 Opiates and metabolites        300 Cocaine and metabolites        300 THC                            50 Performed at [facility], [address], [place], [place] [address]     PSYCHIATRIC REVIEW OF SYSTEMS (ROS)  ROS: Notable for the following relevant positive findings: ROS  Additional findings:      Musculoskeletal: No abnormal movements observed      Gait & Station: Laying/Sitting      Pain Screening: Denies   RISK FORMULATION/ASSESSMENT  Is the patient experiencing any suicidal or homicidal ideations: Yes       Explain if yes: Plan to jump out the window and run into traffic  Protective factors considered for safety management: help accepting   Risk factors/concerns considered for safety management:  Impulsivity  Is there a safety management plan with the patient and treatment team to minimize risk factors and promote protective factors: Yes           Explain: Inpatient admission  Is crisis care placement or psychiatric hospitalization recommended: Yes     Based on my current evaluation and risk assessment, patient is determined at this time to be at:  High risk  *RISK ASSESSMENT Risk assessment is a dynamic process; it is possible that this patient's condition, and risk level, may change. This should be re-evaluated and managed over time as appropriate. Please re-consult psychiatric consult services if additional assistance is needed in terms of risk assessment and  management. If your team decides to discharge this patient, please advise the patient how to best access emergency psychiatric services, or to call 911, if their condition worsens or they feel unsafe in any way.   Dian Situ, MD Telepsychiatry Consult ServicesPatient ID: Bjorn Loser, male   DOB: May 31, 1995, 27 y.o.   MRN: [id number]

## 2023-01-14 NOTE — ED Notes (Signed)
Pt took shower, washed hair, new scrubs and socks.

## 2023-01-14 NOTE — BH Assessment (Signed)
This TTS consult will be completed by [facility]. [facility] Coordinator will communicate in established secure chat provider name and time of assessment. Thanks

## 2023-01-15 NOTE — ED Notes (Signed)
Pt went to sleep 

## 2023-01-15 NOTE — ED Notes (Signed)
Pt was accepted to [facility] TODAY 01/15/2023 Pt meets inpatient criteria per Joaquim Nam Attending Physician will be Dr. Phyllis Ginger. [place], MD Report can be called to: - [phone] Pt can arrive after:11:00am Care Team notified:Sirus Labrie Madelaine Etienne [place],LCSWA

## 2023-01-15 NOTE — ED Notes (Addendum)
Called report to Camera operator at [facility]

## 2023-01-15 NOTE — ED Notes (Signed)
Attempted to call pt's mother for consent for transfer with no answer. Voicemail message left for call back.

## 2023-01-15 NOTE — ED Notes (Signed)
[facility] called pts Otelia Santee at pts group home to confirm that if discharged pt would be eligible to return. The phone went to voicemail and a message could not be left due to the mail box being full.   Jacquelynn Cree, [facility] 01/15/23

## 2023-01-15 NOTE — ED Notes (Signed)
Called pt's legal guardian to attempt to obtain consent for voluntary transfer to [facility]. Left voicemail.

## 2023-01-15 NOTE — ED Notes (Signed)
Pt continues to refuse to lay down and continued to sit in the chair.

## 2023-01-15 NOTE — Progress Notes (Addendum)
This CSW spoke with pt's mother who is pt's LG; Dean Shepherd [phone]. This CSW introduced self and explained the role of CSW.  Pt's mother does not agree with voluntary placement to [facility]. CSW used active listening skills while LG provided insight. LG shared that pt loves/ enjoys coming to the hospital and takes it as a break from the group home. LG is fearful that pt will lose his placement at the group if pt is gone for a while and placed in a different setting such as BH placement. LG shared that pt typically has a yearly episode and wants to leave the group home. LG shared that she spoke with psych provider Alona Bene, PMHNP via phone and explained her thoughts on pt's care. LG shared that she would like to safety plan and pt to return to group home. LG thanked CSW for the call and listening to concerns.   Psych Provider was able to coordinate safe discharge plan with group home. Pt will discharge to group home in the morning. CSW will call and cancel [facility] bed.  This CSW received a phone call from [facility] Case Manager Kandyce Rud [phone] who was calling to follow up on case. CSW informed pt will dc to group home 01/16/2023. No identified barriers at this time to DC.   Maryjean Ka, MSW, [facility] 01/15/2023 5:39 PM

## 2023-01-15 NOTE — Progress Notes (Signed)
LCSW Progress Note  [id number]   Dean Shepherd  01/15/2023  2:05 AM    Inpatient Behavioral Health Placement  Pt meets inpatient criteria per Phebe Colla, NP. There are no available beds within [facility] system per [facility] Rona Ravens, RN.  Referral was sent to the following facilities;   Destination  Service Provider Address Phone Fax  [facility] Patient Placement  [facility], [place] [phone]  [facility]  [address], [place] [address] [phone]  [facility]  [address] [place] [address] [phone]  [facility]  [address], [place] [address] [phone]  [facility] [address]  [address], [place] [address] [phone]  [facility]  [address] [place] [address] [phone]  [facility]  [address] [place], [place] [address] [phone]  [facility]  [address], [place] [address] [phone]  [facility] EFAX  [address] [place], [place] [phone]  [facility] Psychiat[facility] Inpatient [place]  [place] [phone]  [facility]D [facility]  [facility], [place] [phone]  [facility] Behavior[facility]  [place] [phone]  [facility] [place] [address] [phone]  [facility]  [facility] [address]egino Bellow [place] [address] [phone]  [facility]  [address] [place], [place] [address] [phone]  [facility]  [address], [place] [address] [phone]  [facility]  [address], [place]  [place] [address] [phone]  [facility]  [address] [place] [address] [phone]  [facility]  [address] [place] [address] [phone]  [facility]  [address] [place] [address] [phone]  [facility]  [address] [place] [address] [phone]  [facility]  [address], [place] [address] [phone]  [facility]  [address], [place] [address] [phone]  [facility]  [address], [place] [address] [phone]  [facility]  [address], [place] [address] [phone]  [facility]  [address] [place] [address] [phone]  [facility]  [address], [place] [address] [phone]  [facility]  [address] [place] [address] [phone]  [facility]  [address] [place], [place] [address] [phone]  [facility]  [address] [place] [address] [phone]  [facility] Center-Adult  [address] [place], [place] [address] [phone]  [facility]  [address] [place] [address] [phone]    Situation ongoing,  CSW will follow up.    Maryjean Ka, MSW, LCSWA 01/15/2023 2:05 AM

## 2023-01-15 NOTE — Progress Notes (Signed)
Pt was accepted to [facility] TODAY 01/15/2023  Pt meets inpatient criteria per Joaquim Nam  Attending Physician will be Dr. Phyllis Ginger. [place], MD  Report can be called to: - [phone]  Pt can arrive after:11:00am  Care Team notified:Melinda Madelaine Etienne [facility] Issai [place],MSW, LCSWA 01/15/2023 @ 2:09 AM

## 2023-01-15 NOTE — ED Notes (Signed)
Pt sat by the window, advising he could not go to sleep and did not want to go to sleep because that's when the suicidal thoughts came to him. Patient stayed by the desk continuously asking the same questions like "what's the time?" "Can I go to the bathroom?" "Can I stay here in case I need to use the bathroom?"

## 2023-01-15 NOTE — ED Provider Notes (Signed)
Emergency Medicine Observation Re-evaluation Note  Dean Shepherd is a 27 y.o. male, with ID, ODD, intermittent explosive disorder, bipolar disorder, aggressive behavior, who was seen on rounds today.  Pt initially presented to the ED for complaints of Suicidal Currently, the patient is sleeping.  Physical Exam  BP 126/81 (BP Location: Right Arm)   Pulse 70   Temp 98.1 F (36.7 C) (Oral)   Resp 16   SpO2 99%  Physical Exam General: Awake and alert, no acute distress Cardiac: Regular rate Lungs: No increased work of breathing Psych: Calm, sleeping  ED Course / MDM  EKG:   I have reviewed the labs performed to date as well as medications administered while in observation.  Recent changes in the last 24 hours include patient medically cleared, recommended for inpatient psych, pending placement.  Plan  Current plan is for inpatient psych at [facility], attending physician Dr. Phyllis Ginger. Jaclynn Major, MD.     Loetta Rough, MD 01/15/23 [phone]

## 2023-01-16 MED ORDER — HYDROXYZINE HCL 25 MG PO TABS: 25.0000 mg | ORAL_TABLET | Freq: Three times a day (TID) | ORAL | Status: DC | PRN

## 2023-01-16 MED ORDER — STERILE WATER FOR INJECTION IJ SOLN
INTRAMUSCULAR | Status: AC
  Administered 2023-01-16: 1.2 mL
  Filled 2023-01-16: qty 10

## 2023-01-16 NOTE — ED Notes (Addendum)
Pt was discharged to go to group home, but he refused to go there with agitation in the lobby. He brought back to his room at [facility].

## 2023-01-16 NOTE — ED Provider Notes (Signed)
This provider spoke with patients community coordinator Wanda Plump, and she stated that patient know exactly what to say to get admitted to an inpatient facility. She states she spoke with patient group home and they are more than happy for him to come back and they are able to pick him up tomorrow on 01/16/2023, around 1730. She ask that we do not tell patient he is returning to the group home, and she ask that he be given a PRN medication for anxiety/ agitation because she feels that patient may become aggressive towards staff. She states that she spoke with patient legal guardian Audria Nine and they are in agreement that patient does not need to be admitted into an inpatient facility at this time. Discussed with Mrs. Jarold Motto that this provider will not be the provider at the hospital tomorrow, but will place in a note her request and it will be up to the day shift provider and RN.

## 2023-01-16 NOTE — Progress Notes (Signed)
01/16/2023  1620  Group home rep called to check to see what has been done in order to get patient ready for d/c. Rep does not believe Atarax will be enough to keep patient from being violent. Rep wanted me to tell patient they were coming to pick him up. Spoke with patient told him group home is coming to pick him up he stated he did not want to go back to group home, I asked him where did he plan on going and he stated he did not know, I explained that we were in the emergency room and we have to send him somewhere, so he then agreed to go back to the group home. Informed Group home rep that patient agreed to go back to group home.

## 2023-01-16 NOTE — ED Provider Notes (Addendum)
Notified that patient has been refusing to go back to his group home.  Staff at the goup home states he often requires medications for agitation prior to discharge.  Patient does have as needed Geodon.  Will give a dose for his agitation.  Patient is stable for discharge   Discussed with the patient that he is ready to go back to the group home.  Patient indicates that he did not want to.  Explained to the patient unfortunately he cannot stay in the ED.  Patient is not suicidal he just does not want to go back to his group home.  Actions seem to be mostly behavioral in nature.  Patient was assessed by psychiatry earlier and was cleared for discharge    Dean Dibbles, MD 01/16/23 2020

## 2023-03-23 ENCOUNTER — Emergency Department (HOSPITAL_COMMUNITY)
Admission: EM | Admit: 2023-03-23 | Discharge: 2023-03-24 | Disposition: A | Discharge status: Home / Self Care | Payer: MEDICAID | Attending: Emergency Medicine | Admitting: Emergency Medicine

## 2023-03-23 ENCOUNTER — Encounter (HOSPITAL_COMMUNITY): Payer: Self-pay

## 2023-03-23 ENCOUNTER — Other Ambulatory Visit: Payer: Self-pay

## 2023-03-23 DIAGNOSIS — W01198A Fall on same level from slipping, tripping and stumbling with subsequent striking against other object, initial encounter: Secondary | ICD-10-CM | POA: Insufficient documentation

## 2023-03-23 DIAGNOSIS — Z91040 Latex allergy status: Secondary | ICD-10-CM | POA: Diagnosis not present

## 2023-03-23 DIAGNOSIS — W19XXXA Unspecified fall, initial encounter: Secondary | ICD-10-CM

## 2023-03-23 DIAGNOSIS — S0101XA Laceration without foreign body of scalp, initial encounter: Secondary | ICD-10-CM | POA: Insufficient documentation

## 2023-03-23 MED ORDER — IBUPROFEN 200 MG PO TABS
400.0000 mg | ORAL_TABLET | Freq: Once | ORAL | Status: AC
  Administered 2023-03-24: 400 mg via ORAL
  Filled 2023-03-23: qty 2

## 2023-03-23 MED ORDER — LIDOCAINE-EPINEPHRINE-TETRACAINE (LET) TOPICAL GEL
3.0000 mL | Freq: Once | TOPICAL | Status: AC
  Administered 2023-03-24: 3 mL via TOPICAL
  Filled 2023-03-23: qty 3

## 2023-03-23 MED ORDER — ONDANSETRON 4 MG PO TBDP
4.0000 mg | ORAL_TABLET | Freq: Once | ORAL | Status: AC
  Administered 2023-03-24: 4 mg via ORAL
  Filled 2023-03-23: qty 1

## 2023-03-23 MED ORDER — ACETAMINOPHEN 325 MG PO TABS
650.0000 mg | ORAL_TABLET | Freq: Once | ORAL | Status: AC
  Administered 2023-03-23: 650 mg via ORAL
  Filled 2023-03-23: qty 2

## 2023-03-23 NOTE — ED Provider Notes (Signed)
 [facility] Provider Note   CSN: [id number] Arrival date & time: 03/23/23  1816     History  Chief Complaint  Patient presents with   Dean Shepherd is a 28 y.o. male.  The history is provided by the patient and a caregiver.  Fall  Dean Shepherd is a 28 y.o. male who presents to the Emergency Department complaining of fall.  Tore is a resident of a group home and he was trying to run out the door when he tripped and fell, striking his head on the entertainment center.  This happened around 1 PM.  He complains of headache, nausea.  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  No history of bleeding disorder.  Tetanus is up-to-date.     Home Medications Prior to Admission medications   Medication Sig Start Date End Date Taking? Authorizing Provider  amantadine  (SYMMETREL ) 100 MG capsule Take 1 capsule (100 mg total) by mouth 2 (two) times daily. Patient taking differently: Take 100 mg by mouth in the morning. 02/02/15   Onuoha, Josephine C, NP  clonazePAM  (KLONOPIN ) 1 MG tablet Take 1 tablet (1 mg total) by mouth 2 (two) times daily. Patient not taking: Reported on 01/14/2023 02/02/15   Onuoha, Josephine C, NP  divalproex  (DEPAKOTE  ER) 500 MG 24 hr tablet Take 1 tablet (500 mg total) by mouth daily. Patient taking differently: Take 500 mg by mouth in the morning and at bedtime. 02/02/15   Onuoha, Josephine C, NP  FLUoxetine  (PROZAC ) 20 MG capsule Take 20 mg by mouth daily.    [provider]  FLUoxetine  (PROZAC ) 40 MG capsule Take 1 capsule (40 mg total) by mouth daily. Patient not taking: Reported on 01/14/2023 02/02/15   Onuoha, Josephine C, NP  hydrOXYzine  (ATARAX ) 25 MG tablet Take 25 mg by mouth 3 (three) times daily as needed for anxiety.    [provider]  lamoTRIgine  (LAMICTAL ) 100 MG tablet Take 1 tablet (100 mg total) by mouth every morning. Patient taking differently: Take 100 mg by mouth 2 (two) times daily.  02/02/15   Onuoha, Josephine C, NP  OLANZapine  (ZYPREXA ) 20 MG tablet Take 20 mg by mouth at bedtime.    [provider]  OLANZapine  (ZYPREXA ) 5 MG tablet Take 5 mg by mouth as needed (agitation or delusions).    [provider]  polyethylene glycol (MIRALAX  / GLYCOLAX ) 17 g packet Take 17 g by mouth daily.    [provider]  propranolol  (INDERAL ) 20 MG tablet Take 1 tablet (20 mg total) by mouth 2 (two) times daily. Patient not taking: Reported on 01/14/2023 02/02/15   Onuoha, Josephine C, NP      Allergies    Latex    Review of Systems   Review of Systems  All other systems reviewed and are negative.   Physical Exam Updated Vital Signs BP 105/71   Pulse 85   Temp 97.9 F (36.6 C) (Oral)   Resp 16   Ht 6' (1.829 m)   Wt 86 kg   SpO2 100%   BMI 25.71 kg/m  Physical Exam Vitals and nursing note reviewed.  Constitutional:      Appearance: He is well-developed.  HENT:     Head: Normocephalic.     Comments: There is a 1-1/2 cm abrasion with curved flap over the left parietal scalp Cardiovascular:     Rate and Rhythm: Normal rate and regular rhythm.  Pulmonary:  Effort: Pulmonary effort is normal. No respiratory distress.  Abdominal:     Palpations: Abdomen is soft.     Tenderness: There is no abdominal tenderness. There is no guarding or rebound.  Musculoskeletal:        General: No tenderness.  Skin:    General: Skin is warm and dry.  Neurological:     Mental Status: He is alert and oriented to person, place, and time.     Comments: Shuffling gait.  Moves all extremities symmetrically  Psychiatric:        Behavior: Behavior normal.     ED Results / Procedures / Treatments   Labs (all labs ordered are listed, but only abnormal results are displayed) Labs Reviewed - No data to display  EKG None  Radiology CT Head Wo Contrast Result Date: 03/24/2023 CLINICAL DATA:  Head trauma, moderate-severe EXAM: CT HEAD WITHOUT CONTRAST  TECHNIQUE: Contiguous axial images were obtained from the base of the skull through the vertex without intravenous contrast. RADIATION DOSE REDUCTION: This exam was performed according to the departmental dose-optimization program which includes automated exposure control, adjustment of the mA and/or kV according to patient size and/or use of iterative reconstruction technique. 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 Stable left posterior approach ventriculoperitoneal catheter. No evidence of large-territorial acute infarction. No parenchymal hemorrhage. No mass lesion. No extra-axial collection. No mass effect or midline shift. No hydrocephalus. Basilar cisterns are patent. Vascular: No hyperdense vessel.  Prior craniotomy. Skull: No acute fracture or focal lesion. Sinuses/Orbits: Paranasal sinuses and mastoid air cells are clear. The orbits are unremarkable. Other: None. IMPRESSION: No acute intracranial abnormality. Electronically Signed   By: Morgane  Naveau M.D.   On: 03/24/2023 01:29    Procedures .Laceration Repair  Date/Time: 03/24/2023 1:41 AM  Performed by: Griselda Norris, MD Authorized by: Griselda Norris, MD   Consent:    Consent obtained:  Verbal   Consent given by:  Healthcare agent Universal protocol:    Patient identity confirmed:  Verbally with patient Anesthesia:    Anesthesia method:  Topical application   Topical anesthetic:  LET Laceration details:    Location:  Scalp   Scalp location:  L parietal   Length (cm):  1.5 Exploration:    Hemostasis achieved with:  Direct pressure   Contaminated: no   Treatment:    Area cleansed with:  Chlorhexidine   Amount of cleaning:  Standard   Debridement:  None Skin repair:    Repair method:  Staples   Number of staples:  2 Approximation:    Approximation:  Close Repair type:    Repair type:  Simple Post-procedure details:    Dressing:   Open (no dressing)     Medications Ordered in ED Medications  acetaminophen  (TYLENOL ) tablet 650 mg (650 mg Oral Given 03/23/23 2149)  lidocaine -EPINEPHrine -tetracaine  (LET) topical gel (3 mLs Topical Given 03/24/23 0002)  ibuprofen  (ADVIL ) tablet 400 mg (400 mg Oral Given 03/24/23 0002)  ondansetron  (ZOFRAN -ODT) disintegrating tablet 4 mg (4 mg Oral Given 03/24/23 0002)  acetaminophen  (TYLENOL ) tablet 500 mg (500 mg Oral Given 03/24/23 0203)    ED Course/ Medical Decision Making/ A&P                                 Medical Decision Making Amount and/or Complexity of Data Reviewed Radiology: ordered.  Risk OTC drugs. Prescription  drug management.   Patient with history of VP shunt here for evaluation after a trip and fall with scalp laceration.  CT head is negative for acute abnormality.  Wound repaired per note.  Plan to discharge home with outpatient follow-up as well as return precautions.  Patient's caregiver at bedside, discussed treatment plan.  No evidence of serious closed head injury.        Final Clinical Impression(s) / ED Diagnoses Final diagnoses:  Fall, initial encounter  Laceration of scalp, initial encounter    Rx / DC Orders ED Discharge Orders     None         Griselda Norris, MD 03/24/23 0210

## 2023-03-23 NOTE — ED Triage Notes (Signed)
 Pt resides at a group home and was trying to run out of group home and fell and hit left side of head on entertainment system.  Pt denies loc, denies headache.  Bleeding controlled.  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

## 2023-03-24 ENCOUNTER — Emergency Department (HOSPITAL_COMMUNITY): Payer: MEDICAID

## 2023-03-24 MED ORDER — ACETAMINOPHEN 500 MG PO TABS
500.0000 mg | ORAL_TABLET | Freq: Once | ORAL | Status: AC
  Administered 2023-03-24: 500 mg via ORAL
  Filled 2023-03-24: qty 1

## 2023-03-24 NOTE — Discharge Instructions (Signed)
 [place] staples will need to be taken out in the next 5 to 7 days.  This can happen at his family doctor, urgent care or the emergency department.
